# Patient Record
Sex: Female | Born: 1940 | Race: White | Hispanic: No | Marital: Single | Smoking: Never smoker
Health system: Southern US, Community
[De-identification: ages and names within clinical notes are randomized; demographics above are authoritative.]

## PROBLEM LIST (undated history)

## (undated) DIAGNOSIS — K648 Other hemorrhoids: Secondary | ICD-10-CM

## (undated) DIAGNOSIS — H3530 Unspecified macular degeneration: Secondary | ICD-10-CM

## (undated) DIAGNOSIS — D696 Thrombocytopenia, unspecified: Secondary | ICD-10-CM

## (undated) DIAGNOSIS — R918 Other nonspecific abnormal finding of lung field: Secondary | ICD-10-CM

## (undated) DIAGNOSIS — M069 Rheumatoid arthritis, unspecified: Secondary | ICD-10-CM

## (undated) DIAGNOSIS — C50919 Malignant neoplasm of unspecified site of unspecified female breast: Secondary | ICD-10-CM

## (undated) DIAGNOSIS — I2699 Other pulmonary embolism without acute cor pulmonale: Secondary | ICD-10-CM

## (undated) DIAGNOSIS — K5792 Diverticulitis of intestine, part unspecified, without perforation or abscess without bleeding: Secondary | ICD-10-CM

## (undated) DIAGNOSIS — R002 Palpitations: Secondary | ICD-10-CM

## (undated) DIAGNOSIS — I341 Nonrheumatic mitral (valve) prolapse: Secondary | ICD-10-CM

## (undated) HISTORY — DX: Rheumatoid arthritis, unspecified: M06.9

## (undated) HISTORY — DX: Other pulmonary embolism without acute cor pulmonale: I26.99

## (undated) HISTORY — DX: Other nonspecific abnormal finding of lung field: R91.8

## (undated) HISTORY — DX: Palpitations: R00.2

## (undated) HISTORY — DX: Nonrheumatic mitral (valve) prolapse: I34.1

## (undated) HISTORY — DX: Diverticulitis of intestine, part unspecified, without perforation or abscess without bleeding: K57.92

## (undated) HISTORY — DX: Unspecified macular degeneration: H35.30

## (undated) HISTORY — DX: Thrombocytopenia, unspecified: D69.6

## (undated) HISTORY — DX: Malignant neoplasm of unspecified site of unspecified female breast: C50.919

---

## 1999-05-06 ENCOUNTER — Encounter: Admission: RE | Admit: 1999-05-06 | Discharge: 1999-05-06 | Payer: Self-pay | Admitting: Obstetrics & Gynecology

## 2000-01-23 ENCOUNTER — Encounter: Admission: RE | Admit: 2000-01-23 | Discharge: 2000-01-23 | Payer: Self-pay | Admitting: Family Medicine

## 2000-01-23 ENCOUNTER — Encounter: Payer: Self-pay | Admitting: Family Medicine

## 2000-04-02 ENCOUNTER — Encounter: Admission: RE | Admit: 2000-04-02 | Discharge: 2000-04-02 | Payer: Self-pay | Admitting: *Deleted

## 2000-04-02 ENCOUNTER — Encounter: Admission: RE | Admit: 2000-04-02 | Discharge: 2000-04-02 | Payer: Self-pay | Admitting: Family Medicine

## 2000-04-23 ENCOUNTER — Encounter: Admission: RE | Admit: 2000-04-23 | Discharge: 2000-04-23 | Payer: Self-pay | Admitting: Family Medicine

## 2000-04-30 ENCOUNTER — Encounter: Admission: RE | Admit: 2000-04-30 | Discharge: 2000-04-30 | Payer: Self-pay | Admitting: Family Medicine

## 2000-05-07 ENCOUNTER — Encounter: Payer: Self-pay | Admitting: Sports Medicine

## 2000-05-07 ENCOUNTER — Encounter: Admission: RE | Admit: 2000-05-07 | Discharge: 2000-05-07 | Payer: Self-pay | Admitting: Sports Medicine

## 2000-06-20 ENCOUNTER — Other Ambulatory Visit: Admission: RE | Admit: 2000-06-20 | Discharge: 2000-06-20 | Payer: Self-pay | Admitting: Obstetrics & Gynecology

## 2001-05-14 ENCOUNTER — Encounter: Admission: RE | Admit: 2001-05-14 | Discharge: 2001-05-14 | Payer: Self-pay | Admitting: Obstetrics & Gynecology

## 2001-05-14 ENCOUNTER — Encounter: Payer: Self-pay | Admitting: Obstetrics & Gynecology

## 2001-07-04 ENCOUNTER — Encounter: Admission: RE | Admit: 2001-07-04 | Discharge: 2001-07-04 | Payer: Self-pay | Admitting: Sports Medicine

## 2001-07-22 ENCOUNTER — Other Ambulatory Visit: Admission: RE | Admit: 2001-07-22 | Discharge: 2001-07-22 | Payer: Self-pay | Admitting: Obstetrics & Gynecology

## 2001-07-24 HISTORY — PX: BREAST LUMPECTOMY: SHX2

## 2002-07-16 ENCOUNTER — Encounter: Admission: RE | Admit: 2002-07-16 | Discharge: 2002-07-16 | Payer: Self-pay | Admitting: Gastroenterology

## 2002-07-16 ENCOUNTER — Encounter: Payer: Self-pay | Admitting: Gastroenterology

## 2002-07-23 ENCOUNTER — Encounter (INDEPENDENT_AMBULATORY_CARE_PROVIDER_SITE_OTHER): Payer: Self-pay | Admitting: Specialist

## 2002-07-23 ENCOUNTER — Encounter: Payer: Self-pay | Admitting: Obstetrics & Gynecology

## 2002-07-23 ENCOUNTER — Encounter: Admission: RE | Admit: 2002-07-23 | Discharge: 2002-07-23 | Payer: Self-pay | Admitting: Obstetrics & Gynecology

## 2002-07-24 DIAGNOSIS — C50919 Malignant neoplasm of unspecified site of unspecified female breast: Secondary | ICD-10-CM

## 2002-07-24 HISTORY — DX: Malignant neoplasm of unspecified site of unspecified female breast: C50.919

## 2002-07-25 ENCOUNTER — Other Ambulatory Visit: Admission: RE | Admit: 2002-07-25 | Discharge: 2002-07-25 | Payer: Self-pay | Admitting: Obstetrics & Gynecology

## 2002-08-07 ENCOUNTER — Encounter: Admission: RE | Admit: 2002-08-07 | Discharge: 2002-08-07 | Payer: Self-pay | Admitting: Sports Medicine

## 2002-08-08 ENCOUNTER — Other Ambulatory Visit: Admission: RE | Admit: 2002-08-08 | Discharge: 2002-08-08 | Payer: Self-pay | Admitting: Radiology

## 2002-08-14 ENCOUNTER — Encounter: Payer: Self-pay | Admitting: *Deleted

## 2002-08-14 ENCOUNTER — Ambulatory Visit (HOSPITAL_COMMUNITY): Admission: RE | Admit: 2002-08-14 | Discharge: 2002-08-14 | Payer: Self-pay | Admitting: *Deleted

## 2002-08-27 ENCOUNTER — Ambulatory Visit (HOSPITAL_COMMUNITY): Admission: RE | Admit: 2002-08-27 | Discharge: 2002-08-27 | Payer: Self-pay | Admitting: Surgery

## 2002-08-27 ENCOUNTER — Encounter: Payer: Self-pay | Admitting: Surgery

## 2002-10-06 ENCOUNTER — Encounter: Admission: RE | Admit: 2002-10-06 | Discharge: 2002-10-06 | Payer: Self-pay | Admitting: *Deleted

## 2002-10-06 ENCOUNTER — Encounter: Payer: Self-pay | Admitting: *Deleted

## 2002-11-20 ENCOUNTER — Encounter: Payer: Self-pay | Admitting: *Deleted

## 2002-11-20 ENCOUNTER — Ambulatory Visit (HOSPITAL_COMMUNITY): Admission: RE | Admit: 2002-11-20 | Discharge: 2002-11-20 | Payer: Self-pay | Admitting: *Deleted

## 2002-12-08 ENCOUNTER — Encounter: Payer: Self-pay | Admitting: Surgery

## 2002-12-08 ENCOUNTER — Encounter: Admission: RE | Admit: 2002-12-08 | Discharge: 2002-12-08 | Payer: Self-pay | Admitting: Surgery

## 2002-12-08 ENCOUNTER — Ambulatory Visit (HOSPITAL_BASED_OUTPATIENT_CLINIC_OR_DEPARTMENT_OTHER): Admission: RE | Admit: 2002-12-08 | Discharge: 2002-12-08 | Payer: Self-pay | Admitting: Surgery

## 2002-12-08 ENCOUNTER — Encounter (INDEPENDENT_AMBULATORY_CARE_PROVIDER_SITE_OTHER): Payer: Self-pay | Admitting: *Deleted

## 2002-12-22 ENCOUNTER — Ambulatory Visit: Admission: RE | Admit: 2002-12-22 | Discharge: 2003-03-17 | Payer: Self-pay | Admitting: Radiation Oncology

## 2002-12-26 ENCOUNTER — Ambulatory Visit (HOSPITAL_BASED_OUTPATIENT_CLINIC_OR_DEPARTMENT_OTHER): Admission: RE | Admit: 2002-12-26 | Discharge: 2002-12-26 | Payer: Self-pay | Admitting: Surgery

## 2003-01-05 ENCOUNTER — Encounter: Admission: RE | Admit: 2003-01-05 | Discharge: 2003-01-05 | Payer: Self-pay | Admitting: Radiation Oncology

## 2003-02-04 ENCOUNTER — Ambulatory Visit (HOSPITAL_COMMUNITY): Admission: RE | Admit: 2003-02-04 | Discharge: 2003-02-04 | Payer: Self-pay | Admitting: Oncology

## 2003-02-04 ENCOUNTER — Encounter: Payer: Self-pay | Admitting: Oncology

## 2003-04-07 ENCOUNTER — Encounter: Admission: RE | Admit: 2003-04-07 | Discharge: 2003-04-07 | Payer: Self-pay | Admitting: Surgery

## 2003-04-07 ENCOUNTER — Encounter: Payer: Self-pay | Admitting: Surgery

## 2003-07-20 ENCOUNTER — Encounter: Admission: RE | Admit: 2003-07-20 | Discharge: 2003-07-20 | Payer: Self-pay | Admitting: Oncology

## 2003-07-27 ENCOUNTER — Encounter: Admission: RE | Admit: 2003-07-27 | Discharge: 2003-07-27 | Payer: Self-pay | Admitting: Oncology

## 2003-08-19 ENCOUNTER — Other Ambulatory Visit: Admission: RE | Admit: 2003-08-19 | Discharge: 2003-08-19 | Payer: Self-pay | Admitting: Obstetrics & Gynecology

## 2004-05-26 ENCOUNTER — Ambulatory Visit: Payer: Self-pay | Admitting: Sports Medicine

## 2004-07-20 ENCOUNTER — Encounter: Admission: RE | Admit: 2004-07-20 | Discharge: 2004-07-20 | Payer: Self-pay | Admitting: Oncology

## 2004-08-10 ENCOUNTER — Ambulatory Visit: Payer: Self-pay | Admitting: Oncology

## 2004-08-19 ENCOUNTER — Other Ambulatory Visit: Admission: RE | Admit: 2004-08-19 | Discharge: 2004-08-19 | Payer: Self-pay | Admitting: Obstetrics & Gynecology

## 2005-02-27 ENCOUNTER — Ambulatory Visit: Payer: Self-pay | Admitting: Oncology

## 2005-07-21 ENCOUNTER — Encounter: Admission: RE | Admit: 2005-07-21 | Discharge: 2005-07-21 | Payer: Self-pay | Admitting: Oncology

## 2005-07-26 ENCOUNTER — Ambulatory Visit: Payer: Self-pay | Admitting: Oncology

## 2005-08-22 ENCOUNTER — Other Ambulatory Visit: Admission: RE | Admit: 2005-08-22 | Discharge: 2005-08-22 | Payer: Self-pay | Admitting: Obstetrics & Gynecology

## 2005-11-02 ENCOUNTER — Ambulatory Visit: Payer: Self-pay | Admitting: Family Medicine

## 2005-12-08 ENCOUNTER — Emergency Department (HOSPITAL_COMMUNITY): Admission: EM | Admit: 2005-12-08 | Discharge: 2005-12-08 | Payer: Self-pay | Admitting: Family Medicine

## 2005-12-19 ENCOUNTER — Emergency Department (HOSPITAL_COMMUNITY): Admission: EM | Admit: 2005-12-19 | Discharge: 2005-12-20 | Payer: Self-pay | Admitting: Emergency Medicine

## 2005-12-21 ENCOUNTER — Ambulatory Visit: Payer: Self-pay | Admitting: Sports Medicine

## 2005-12-27 ENCOUNTER — Encounter: Admission: RE | Admit: 2005-12-27 | Discharge: 2005-12-27 | Payer: Self-pay | Admitting: Oncology

## 2005-12-29 ENCOUNTER — Ambulatory Visit: Payer: Self-pay | Admitting: Oncology

## 2006-01-04 ENCOUNTER — Encounter: Admission: RE | Admit: 2006-01-04 | Discharge: 2006-01-04 | Payer: Self-pay | Admitting: Oncology

## 2006-01-25 LAB — COMPREHENSIVE METABOLIC PANEL
Albumin: 3.9 g/dL (ref 3.5–5.2)
Alkaline Phosphatase: 62 U/L (ref 39–117)
BUN: 19 mg/dL (ref 6–23)
CO2: 24 mEq/L (ref 19–32)
Glucose, Bld: 101 mg/dL — ABNORMAL HIGH (ref 70–99)
Potassium: 3.7 mEq/L (ref 3.5–5.3)
Sodium: 140 mEq/L (ref 135–145)
Total Protein: 6.7 g/dL (ref 6.0–8.3)

## 2006-01-25 LAB — URINALYSIS, MICROSCOPIC - CHCC
Bilirubin (Urine): NEGATIVE
Glucose: NEGATIVE g/dL
Ketones: NEGATIVE mg/dL
Leukocyte Esterase: NEGATIVE

## 2006-01-25 LAB — CBC WITH DIFFERENTIAL/PLATELET
BASO%: 0.1 % (ref 0.0–2.0)
EOS%: 2.6 % (ref 0.0–7.0)
MCHC: 34.5 g/dL (ref 32.0–36.0)
MONO#: 0.4 10*3/uL (ref 0.1–0.9)
RBC: 4.01 10*6/uL (ref 3.70–5.32)
RDW: 14.1 % (ref 11.3–14.5)
WBC: 4.4 10*3/uL (ref 3.9–10.0)
lymph#: 1.2 10*3/uL (ref 0.9–3.3)

## 2006-02-08 ENCOUNTER — Encounter: Admission: RE | Admit: 2006-02-08 | Discharge: 2006-02-08 | Payer: Self-pay | Admitting: Oncology

## 2006-03-21 ENCOUNTER — Ambulatory Visit: Payer: Self-pay | Admitting: Oncology

## 2006-07-23 ENCOUNTER — Encounter: Admission: RE | Admit: 2006-07-23 | Discharge: 2006-07-23 | Payer: Self-pay | Admitting: Oncology

## 2006-07-24 HISTORY — PX: OTHER SURGICAL HISTORY: SHX169

## 2006-08-27 ENCOUNTER — Encounter: Admission: RE | Admit: 2006-08-27 | Discharge: 2006-08-27 | Payer: Self-pay | Admitting: Oncology

## 2006-09-06 ENCOUNTER — Ambulatory Visit: Payer: Self-pay | Admitting: Sports Medicine

## 2006-09-20 DIAGNOSIS — L719 Rosacea, unspecified: Secondary | ICD-10-CM

## 2006-09-20 DIAGNOSIS — I059 Rheumatic mitral valve disease, unspecified: Secondary | ICD-10-CM | POA: Insufficient documentation

## 2006-09-25 ENCOUNTER — Ambulatory Visit (HOSPITAL_COMMUNITY): Admission: RE | Admit: 2006-09-25 | Discharge: 2006-09-25 | Payer: Self-pay | Admitting: Sports Medicine

## 2006-09-25 ENCOUNTER — Encounter: Payer: Self-pay | Admitting: Cardiology

## 2006-11-01 ENCOUNTER — Encounter: Payer: Self-pay | Admitting: Sports Medicine

## 2006-11-13 ENCOUNTER — Encounter: Payer: Self-pay | Admitting: Sports Medicine

## 2007-02-12 ENCOUNTER — Ambulatory Visit: Payer: Self-pay | Admitting: Oncology

## 2007-02-14 ENCOUNTER — Encounter: Payer: Self-pay | Admitting: Sports Medicine

## 2007-02-14 LAB — COMPREHENSIVE METABOLIC PANEL
ALT: <8 U/L (ref 0–35)
AST: 13 U/L (ref 0–37)
Albumin: 3.8 g/dL (ref 3.5–5.2)
Alkaline Phosphatase: 53 U/L (ref 39–117)
BUN: 20 mg/dL (ref 6–23)
CO2: 24 mEq/L (ref 19–32)
Calcium: 8.5 mg/dL (ref 8.4–10.5)
Chloride: 108 mEq/L (ref 96–112)
Creatinine, Ser: 0.88 mg/dL (ref 0.40–1.20)
Glucose, Bld: 92 mg/dL (ref 70–99)
Potassium: 3.8 mEq/L (ref 3.5–5.3)
Sodium: 142 mEq/L (ref 135–145)
Total Bilirubin: 0.6 mg/dL (ref 0.3–1.2)
Total Protein: 6.3 g/dL (ref 6.0–8.3)

## 2007-02-14 LAB — CBC WITH DIFFERENTIAL/PLATELET
BASO%: 0.2 % (ref 0.0–2.0)
Basophils Absolute: 0 10*3/uL (ref 0.0–0.1)
EOS%: 2.9 % (ref 0.0–7.0)
Eosinophils Absolute: 0.1 10*3/uL (ref 0.0–0.5)
HCT: 38 % (ref 34.8–46.6)
HGB: 13.4 g/dL (ref 11.6–15.9)
LYMPH%: 25.7 % (ref 14.0–48.0)
MCH: 32.8 pg (ref 26.0–34.0)
MCHC: 35.3 g/dL (ref 32.0–36.0)
MCV: 92.7 fL (ref 81.0–101.0)
MONO#: 0.4 10*3/uL (ref 0.1–0.9)
MONO%: 9.4 % (ref 0.0–13.0)
NEUT#: 2.8 10*3/uL (ref 1.5–6.5)
NEUT%: 61.8 % (ref 39.6–76.8)
Platelets: 135 10*3/uL — ABNORMAL LOW (ref 145–400)
RBC: 4.09 10*6/uL (ref 3.70–5.32)
RDW: 13.4 % (ref 11.3–14.5)
WBC: 4.4 10*3/uL (ref 3.9–10.0)
lymph#: 1.1 10*3/uL (ref 0.9–3.3)

## 2007-03-24 ENCOUNTER — Inpatient Hospital Stay (HOSPITAL_COMMUNITY): Admission: EM | Admit: 2007-03-24 | Discharge: 2007-03-27 | Payer: Self-pay | Admitting: Emergency Medicine

## 2007-03-26 ENCOUNTER — Encounter (INDEPENDENT_AMBULATORY_CARE_PROVIDER_SITE_OTHER): Payer: Self-pay | Admitting: Emergency Medicine

## 2007-03-26 ENCOUNTER — Ambulatory Visit: Payer: Self-pay | Admitting: *Deleted

## 2007-07-25 DIAGNOSIS — I2699 Other pulmonary embolism without acute cor pulmonale: Secondary | ICD-10-CM

## 2007-07-25 HISTORY — DX: Other pulmonary embolism without acute cor pulmonale: I26.99

## 2007-07-30 ENCOUNTER — Encounter: Admission: RE | Admit: 2007-07-30 | Discharge: 2007-07-30 | Payer: Self-pay | Admitting: Oncology

## 2007-07-30 ENCOUNTER — Encounter: Payer: Self-pay | Admitting: Sports Medicine

## 2007-08-15 ENCOUNTER — Ambulatory Visit: Payer: Self-pay | Admitting: Oncology

## 2007-08-26 ENCOUNTER — Encounter: Admission: RE | Admit: 2007-08-26 | Discharge: 2007-08-26 | Payer: Self-pay | Admitting: Oncology

## 2007-09-22 ENCOUNTER — Encounter: Payer: Self-pay | Admitting: Sports Medicine

## 2007-10-10 ENCOUNTER — Encounter: Payer: Self-pay | Admitting: *Deleted

## 2007-10-10 ENCOUNTER — Ambulatory Visit: Payer: Self-pay | Admitting: Sports Medicine

## 2007-10-10 DIAGNOSIS — F411 Generalized anxiety disorder: Secondary | ICD-10-CM | POA: Insufficient documentation

## 2007-10-10 DIAGNOSIS — Z853 Personal history of malignant neoplasm of breast: Secondary | ICD-10-CM

## 2007-10-22 ENCOUNTER — Encounter: Payer: Self-pay | Admitting: Sports Medicine

## 2007-11-08 ENCOUNTER — Ambulatory Visit: Payer: Self-pay | Admitting: Family Medicine

## 2007-11-08 DIAGNOSIS — M79609 Pain in unspecified limb: Secondary | ICD-10-CM | POA: Insufficient documentation

## 2007-11-08 DIAGNOSIS — M549 Dorsalgia, unspecified: Secondary | ICD-10-CM | POA: Insufficient documentation

## 2007-11-08 LAB — CONVERTED CEMR LAB
Glucose, Urine, Semiquant: NEGATIVE
Ketones, urine, test strip: NEGATIVE
Specific Gravity, Urine: 1.02

## 2007-12-02 ENCOUNTER — Telehealth (INDEPENDENT_AMBULATORY_CARE_PROVIDER_SITE_OTHER): Payer: Self-pay | Admitting: *Deleted

## 2007-12-06 ENCOUNTER — Emergency Department (HOSPITAL_COMMUNITY): Admission: EM | Admit: 2007-12-06 | Discharge: 2007-12-06 | Payer: Self-pay | Admitting: Emergency Medicine

## 2007-12-10 ENCOUNTER — Ambulatory Visit: Payer: Self-pay | Admitting: Sports Medicine

## 2007-12-10 DIAGNOSIS — M159 Polyosteoarthritis, unspecified: Secondary | ICD-10-CM | POA: Insufficient documentation

## 2008-01-17 ENCOUNTER — Ambulatory Visit: Payer: Self-pay | Admitting: Oncology

## 2008-01-21 ENCOUNTER — Encounter: Payer: Self-pay | Admitting: Family Medicine

## 2008-01-22 LAB — ANTI-NUCLEAR AB-TITER (ANA TITER): ANA Titer 1: 1:80 {titer} — ABNORMAL HIGH

## 2008-01-22 LAB — ANTI-DNA ANTIBODY, DOUBLE-STRANDED: ds DNA Ab: <1 IU/mL (ref <5)

## 2008-01-22 LAB — RHEUMATOID FACTOR: Rhuematoid fact SerPl-aCnc: 45 IU/mL — ABNORMAL HIGH (ref 0–20)

## 2008-02-03 ENCOUNTER — Ambulatory Visit: Payer: Self-pay | Admitting: Psychiatry

## 2008-02-17 ENCOUNTER — Ambulatory Visit: Payer: Self-pay | Admitting: Psychiatry

## 2008-02-17 ENCOUNTER — Encounter: Payer: Self-pay | Admitting: Sports Medicine

## 2008-02-17 LAB — CBC WITH DIFFERENTIAL/PLATELET
BASO%: 0.4 % (ref 0.0–2.0)
EOS%: 0.9 % (ref 0.0–7.0)
HCT: 39.1 % (ref 34.8–46.6)
LYMPH%: 15.1 % (ref 14.0–48.0)
MCH: 32.4 pg (ref 26.0–34.0)
MCHC: 34.1 g/dL (ref 32.0–36.0)
NEUT%: 78.2 % — ABNORMAL HIGH (ref 39.6–76.8)
Platelets: 147 10*3/uL (ref 145–400)
RBC: 4.12 10*6/uL (ref 3.70–5.32)
lymph#: 2.1 10*3/uL (ref 0.9–3.3)

## 2008-02-17 LAB — COMPREHENSIVE METABOLIC PANEL
ALT: 12 U/L (ref 0–35)
AST: 14 U/L (ref 0–37)
Alkaline Phosphatase: 51 U/L (ref 39–117)
Creatinine, Ser: 1.17 mg/dL (ref 0.40–1.20)
Total Bilirubin: 0.8 mg/dL (ref 0.3–1.2)

## 2008-02-24 ENCOUNTER — Ambulatory Visit: Payer: Self-pay | Admitting: Psychiatry

## 2008-02-27 ENCOUNTER — Telehealth: Payer: Self-pay | Admitting: *Deleted

## 2008-05-25 ENCOUNTER — Ambulatory Visit: Payer: Self-pay | Admitting: Surgery

## 2008-05-25 ENCOUNTER — Inpatient Hospital Stay (HOSPITAL_COMMUNITY): Admission: EM | Admit: 2008-05-25 | Discharge: 2008-05-27 | Payer: Self-pay | Admitting: *Deleted

## 2008-05-26 ENCOUNTER — Encounter (INDEPENDENT_AMBULATORY_CARE_PROVIDER_SITE_OTHER): Payer: Self-pay | Admitting: Internal Medicine

## 2008-07-24 HISTORY — PX: US ECHOCARDIOGRAPHY: HXRAD669

## 2008-08-04 ENCOUNTER — Encounter: Admission: RE | Admit: 2008-08-04 | Discharge: 2008-08-04 | Payer: Self-pay | Admitting: Oncology

## 2008-09-22 ENCOUNTER — Encounter: Admission: RE | Admit: 2008-09-22 | Discharge: 2008-09-22 | Payer: Self-pay | Admitting: Cardiology

## 2009-01-19 ENCOUNTER — Emergency Department (HOSPITAL_COMMUNITY): Admission: EM | Admit: 2009-01-19 | Discharge: 2009-01-19 | Payer: Self-pay | Admitting: Emergency Medicine

## 2009-02-26 ENCOUNTER — Ambulatory Visit: Payer: Self-pay | Admitting: Oncology

## 2009-03-02 ENCOUNTER — Encounter: Payer: Self-pay | Admitting: Sports Medicine

## 2009-03-30 ENCOUNTER — Ambulatory Visit: Payer: Self-pay | Admitting: Oncology

## 2009-04-06 LAB — LUPUS ANTICOAGULANT PANEL
Drvvt confirmation: 1.54 Ratio — ABNORMAL HIGH (ref <1.18)
Lupus Anticoagulant: DETECTED
PTTLA Confirmation: 0 secs (ref <8.0)

## 2009-04-06 LAB — ANTITHROMBIN III: AntiThromb III Func: 103 % (ref 76–126)

## 2009-04-06 LAB — CARDIOLIPIN ANTIBODIES, IGG, IGM, IGA
Anticardiolipin IgA: <10 APL U/mL (ref <10)
Anticardiolipin IgM: <10 MPL U/mL (ref <10)

## 2009-04-06 LAB — PROTHROMBIN GENE MUTATION

## 2009-04-06 LAB — FACTOR 5 LEIDEN

## 2009-04-08 ENCOUNTER — Encounter: Payer: Self-pay | Admitting: Family Medicine

## 2009-04-11 LAB — CARDIOLIPIN ANTIBODIES, IGG, IGM, IGA
Anticardiolipin IgG: <10 GPL U/mL (ref <10)
Anticardiolipin IgM: <10 MPL U/mL (ref <10)

## 2009-04-11 LAB — BETA-2 GLYCOPROTEIN ANTIBODIES
Beta-2 Glyco I IgG: 11 SGU (ref <=20)
Beta-2-Glycoprotein I IgA: <9 SAU (ref <=20)

## 2009-06-29 ENCOUNTER — Ambulatory Visit: Payer: Self-pay | Admitting: Oncology

## 2009-07-06 LAB — LUPUS ANTICOAGULANT PANEL
PTT Lupus Anticoagulant: 51.4 secs — ABNORMAL HIGH (ref 32.0–43.4)
PTTLA 4:1 Mix: 45 secs (ref 36.3–48.8)
PTTLA Confirmation: 0.3 secs (ref <8.0)

## 2009-07-06 LAB — BETA-2 GLYCOPROTEIN ANTIBODIES
Beta-2-Glycoprotein I IgA: <3 U/mL (ref <=15)
Beta-2-Glycoprotein I IgM: 5 U/mL (ref <=15)

## 2009-07-06 LAB — CARDIOLIPIN ANTIBODIES, IGG, IGM, IGA
Anticardiolipin IgA: <3 APL U/mL (ref <10)
Anticardiolipin IgG: 3 GPL U/mL (ref <10)
Anticardiolipin IgM: <10 MPL U/mL (ref <10)

## 2009-08-05 ENCOUNTER — Encounter: Admission: RE | Admit: 2009-08-05 | Discharge: 2009-08-05 | Payer: Self-pay | Admitting: Oncology

## 2009-08-12 ENCOUNTER — Encounter: Admission: RE | Admit: 2009-08-12 | Discharge: 2009-08-12 | Payer: Self-pay | Admitting: Family Medicine

## 2009-09-04 ENCOUNTER — Emergency Department (HOSPITAL_COMMUNITY): Admission: EM | Admit: 2009-09-04 | Discharge: 2009-09-04 | Payer: Self-pay | Admitting: Emergency Medicine

## 2009-10-27 ENCOUNTER — Encounter: Admission: RE | Admit: 2009-10-27 | Discharge: 2009-10-27 | Payer: Self-pay | Admitting: Family Medicine

## 2009-10-31 IMAGING — CT CT CHEST W/O CM
2 of 3 series · 16 of 36 positions shown, 20 images · IV contrast (agent unspecified)
Comparison: 08/27/06.

CLINICAL DATA: Breast carcinoma.  Follow-up indeterminate pulmonary nodules. 
 CHEST CT WITHOUT CONTRAST:
TECHNIQUE: Multidetector CT imaging of the chest was performed following the standard protocol without IV contrast.

[Series 2: routine chest · axial · 0.70mm/px · z∈[-270,+30]mm · 13 of 68 slices shown, 17 images]
[im 5/68  mediastinal]
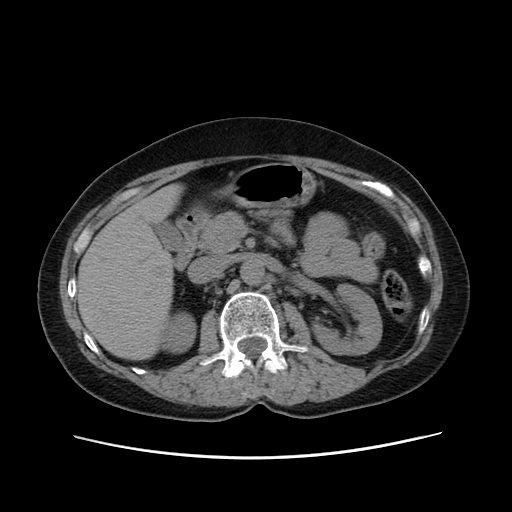
[im 5/68  lung]
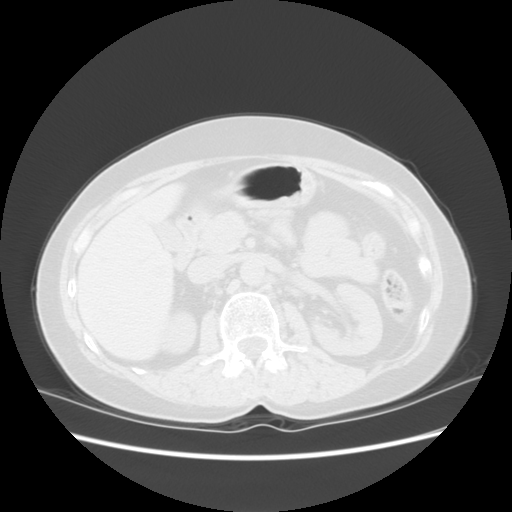
[im 10/68  lung]
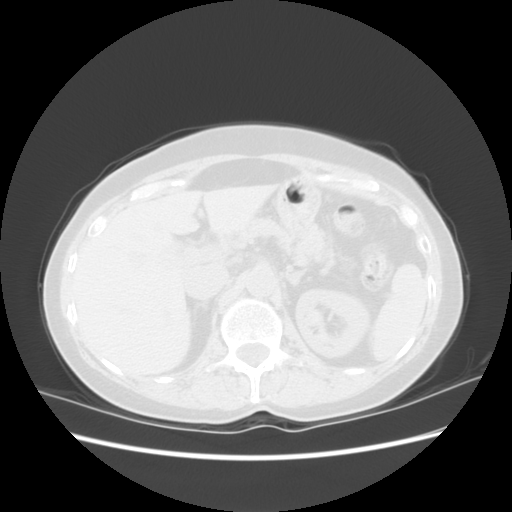
[im 15/68  lung]
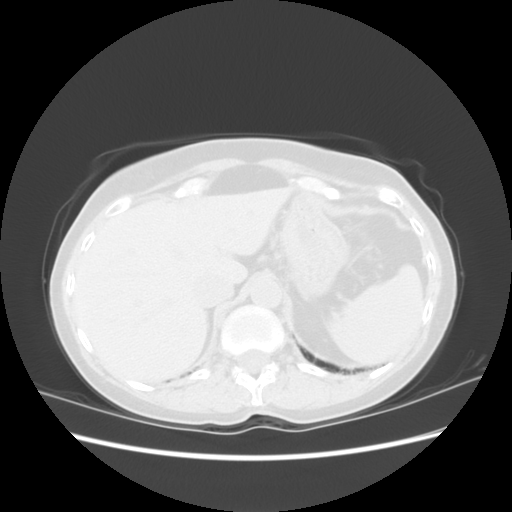
[im 20/68  lung]
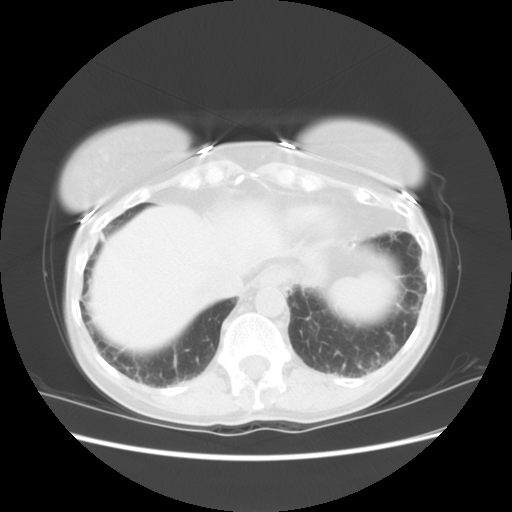
[im 25/68  mediastinal]
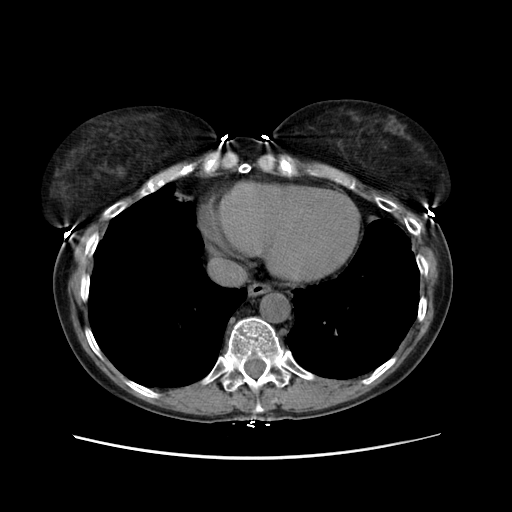
[im 25/68  lung]
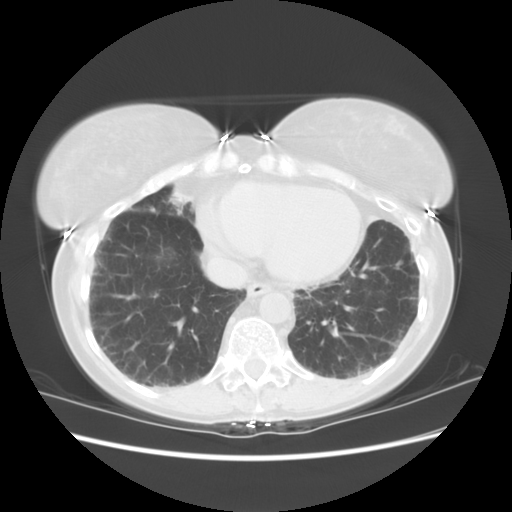
[im 30/68  lung]
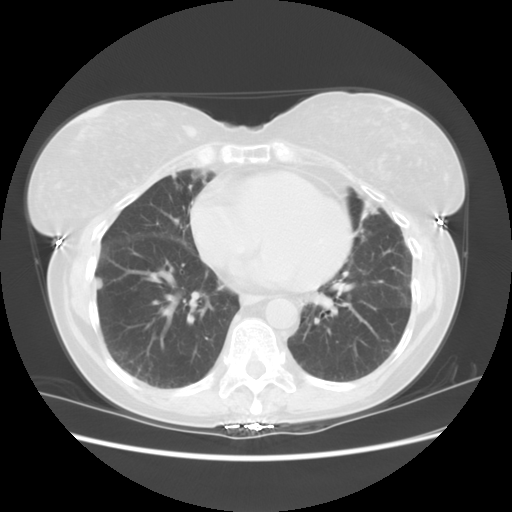
[im 35/68  lung]
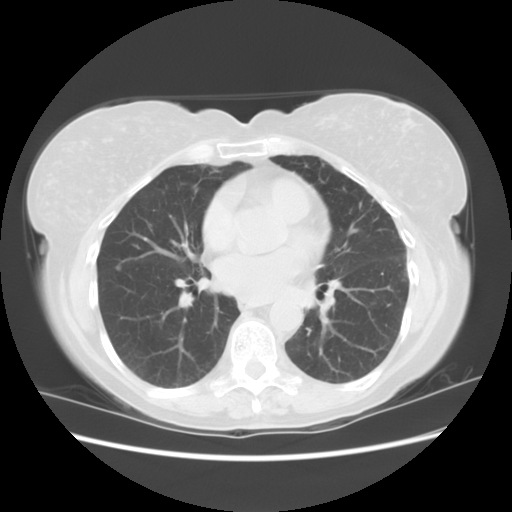
[im 40/68  lung]
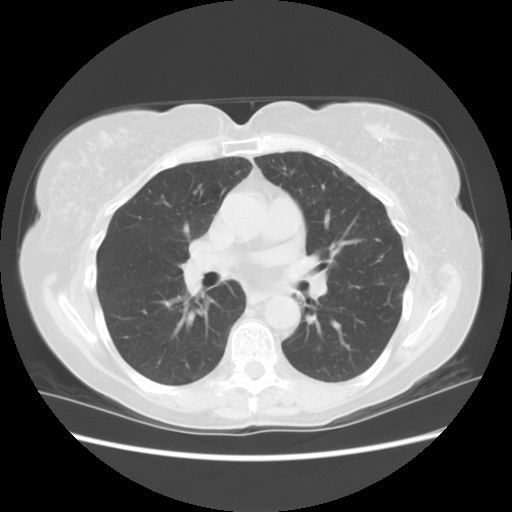
[im 45/68  mediastinal]
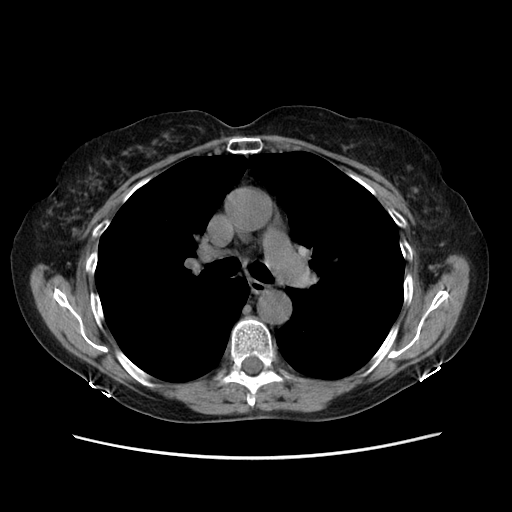
[im 45/68  lung]
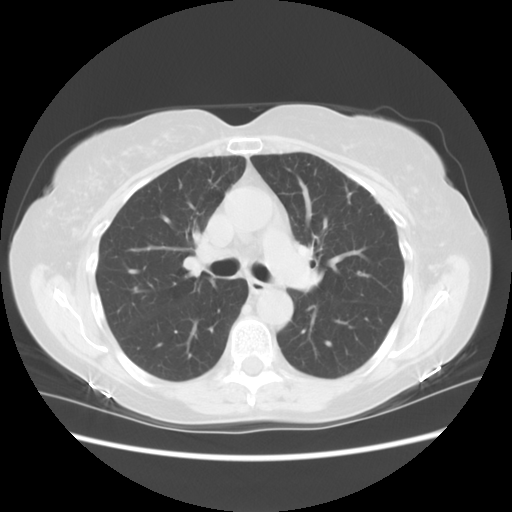
[im 50/68  lung]
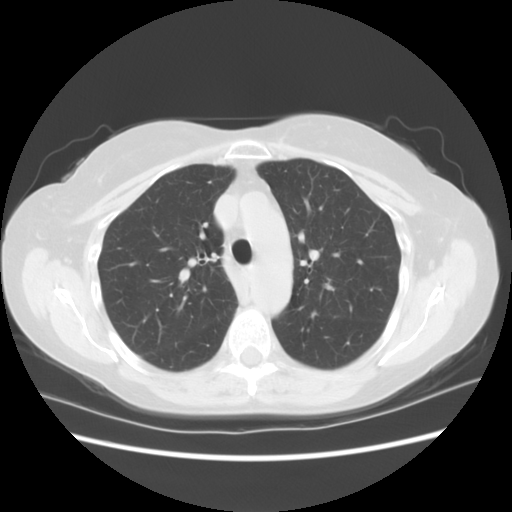
[im 55/68  lung]
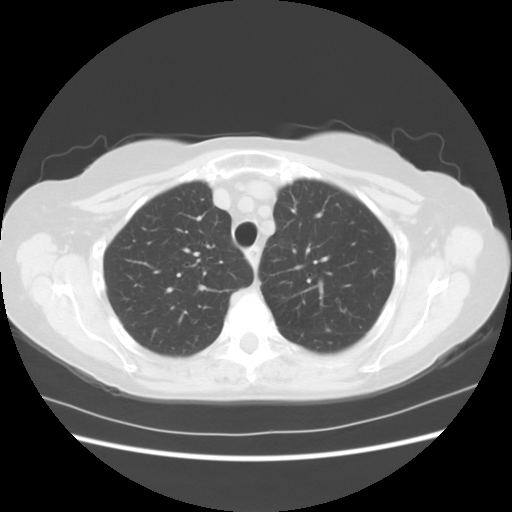
[im 60/68  lung]
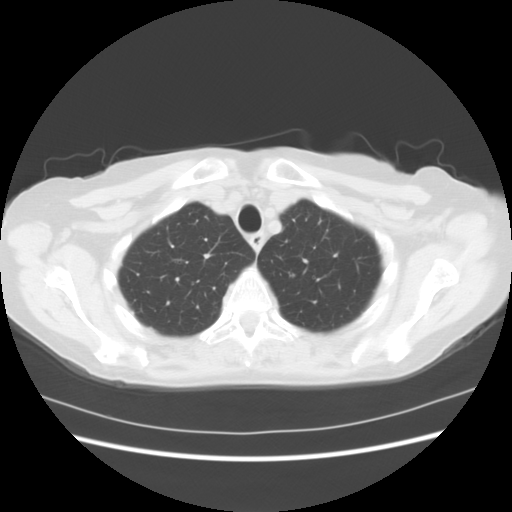
[im 65/68  mediastinal]
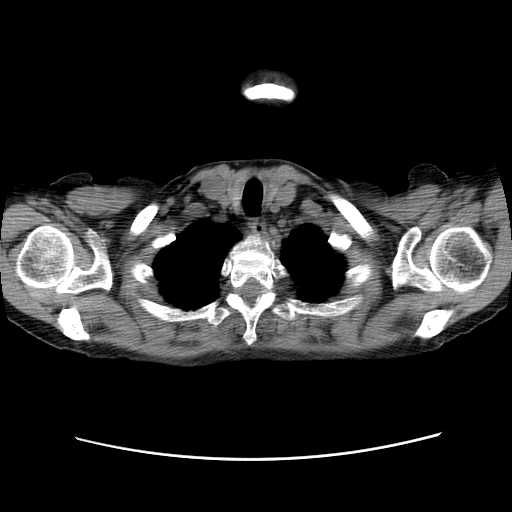
[im 65/68  lung]
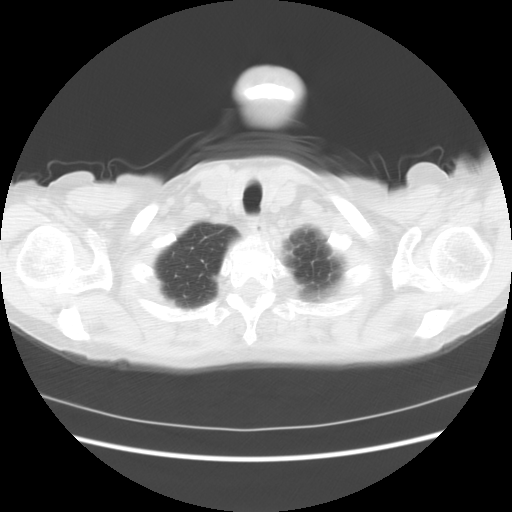

[Series 401: coronal · coronal · 0.70mm/px · 3 of 127 slices shown]
[im 26/127  lung]
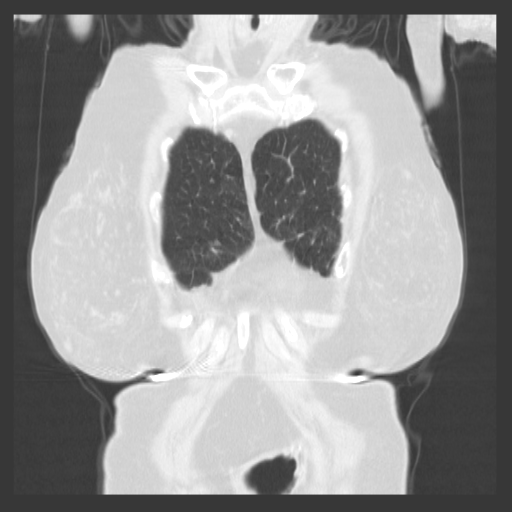
[im 51/127  lung]
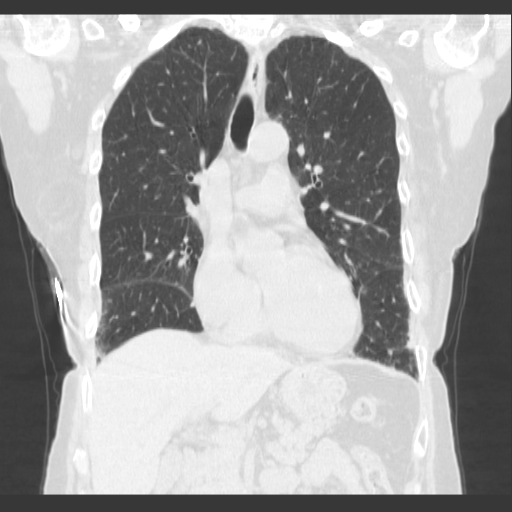
[im 76/127  lung]
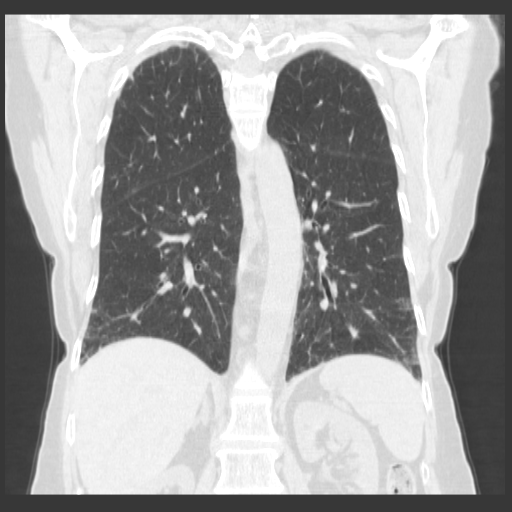

[16 of 36 positions shown; findings below may reference images not displayed]

FINDINGS: Smaller than 1 cm nodules in the right lung remain stable dating back to prior CTs from 0669 and 2881 and are consistent with a benign etiology.  No new or enlarging pulmonary nodules or masses are identified.  There is no evidence of pulmonary infiltrate.  There is no evidence of pleural or pericardial effusion.  There is no evidence of hilar or mediastinal lymphadenopathy.
IMPRESSION: Stable small right lung nodules consistent with benign etiology.  No evidence of metastatic disease or other acute findings.

## 2010-03-17 ENCOUNTER — Ambulatory Visit: Payer: Self-pay | Admitting: Cardiology

## 2010-03-23 ENCOUNTER — Ambulatory Visit: Payer: Self-pay | Admitting: Oncology

## 2010-03-25 ENCOUNTER — Encounter: Payer: Self-pay | Admitting: Family Medicine

## 2010-03-31 ENCOUNTER — Ambulatory Visit: Payer: Self-pay | Admitting: Cardiology

## 2010-04-28 ENCOUNTER — Ambulatory Visit: Payer: Self-pay | Admitting: Cardiology

## 2010-05-30 ENCOUNTER — Ambulatory Visit: Payer: Self-pay | Admitting: Cardiology

## 2010-06-30 ENCOUNTER — Ambulatory Visit: Payer: Self-pay | Admitting: Cardiology

## 2010-07-28 ENCOUNTER — Ambulatory Visit: Payer: Self-pay | Admitting: Oncology

## 2010-07-29 LAB — CARDIOLIPIN ANTIBODIES, IGG, IGM, IGA
Anticardiolipin IgA: 3 APL U/mL (ref <22)
Anticardiolipin IgG: 3 GPL U/mL (ref <23)
Anticardiolipin IgM: 6 MPL U/mL (ref <11)

## 2010-07-29 LAB — LUPUS ANTICOAGULANT PANEL
DRVVT 1:1 Mix: 42 secs (ref 36.2–44.3)
DRVVT: 64.5 secs — ABNORMAL HIGH (ref 36.2–44.3)
Lupus Anticoagulant: NOT DETECTED
PTT Lupus Anticoagulant: 56.6 secs — ABNORMAL HIGH (ref 30.0–45.6)
PTTLA 4:1 Mix: 46.4 secs — ABNORMAL HIGH (ref 30.0–45.6)
PTTLA Confirmation: 2.4 secs (ref <8.0)

## 2010-07-29 LAB — BETA-2 GLYCOPROTEIN ANTIBODIES
Beta-2 Glyco I IgG: 0 G Units (ref <20)
Beta-2-Glycoprotein I IgA: 3 A Units (ref <20)
Beta-2-Glycoprotein I IgM: 25 M Units — ABNORMAL HIGH (ref <20)

## 2010-08-08 ENCOUNTER — Ambulatory Visit: Payer: Self-pay | Admitting: Cardiology

## 2010-08-11 ENCOUNTER — Encounter
Admission: RE | Admit: 2010-08-11 | Discharge: 2010-08-11 | Payer: Self-pay | Source: Home / Self Care | Attending: Oncology | Admitting: Oncology

## 2010-08-14 ENCOUNTER — Encounter: Payer: Self-pay | Admitting: Oncology

## 2010-08-22 ENCOUNTER — Ambulatory Visit: Payer: Self-pay | Admitting: Cardiology

## 2010-08-23 NOTE — Consult Note (Signed)
Summary: [facility]  [facility]   Imported By: Clydell Hakim 04/14/2010 12:01:11  _____________________________________________________________________  External Attachment:    Type:   Image     Comment:   External Document

## 2010-08-23 NOTE — Consult Note (Signed)
Summary: [facility]  [facility]   Imported By: Clydell Hakim 04/14/2010 12:00:21  _____________________________________________________________________  External Attachment:    Type:   Image     Comment:   External Document

## 2010-09-01 ENCOUNTER — Ambulatory Visit (INDEPENDENT_AMBULATORY_CARE_PROVIDER_SITE_OTHER): Payer: Medicare Other | Admitting: Cardiology

## 2010-09-01 DIAGNOSIS — I2699 Other pulmonary embolism without acute cor pulmonale: Secondary | ICD-10-CM

## 2010-09-01 DIAGNOSIS — I059 Rheumatic mitral valve disease, unspecified: Secondary | ICD-10-CM

## 2010-09-14 ENCOUNTER — Other Ambulatory Visit: Payer: Self-pay

## 2010-09-14 ENCOUNTER — Encounter: Payer: Self-pay | Admitting: *Deleted

## 2010-09-29 ENCOUNTER — Encounter (INDEPENDENT_AMBULATORY_CARE_PROVIDER_SITE_OTHER): Payer: Medicare Other

## 2010-09-29 DIAGNOSIS — I2699 Other pulmonary embolism without acute cor pulmonale: Secondary | ICD-10-CM

## 2010-09-29 DIAGNOSIS — Z7901 Long term (current) use of anticoagulants: Secondary | ICD-10-CM

## 2010-10-12 LAB — DIFFERENTIAL
Basophils Relative: 0 % (ref 0–1)
Eosinophils Relative: 1 % (ref 0–5)
Lymphs Abs: 1.9 10*3/uL (ref 0.7–4.0)

## 2010-10-12 LAB — CBC
HCT: 38.2 % (ref 36.0–46.0)
Hemoglobin: 13.2 g/dL (ref 12.0–15.0)
MCHC: 34.5 g/dL (ref 30.0–36.0)
MCV: 96.3 fL (ref 78.0–100.0)
WBC: 8.7 10*3/uL (ref 4.0–10.5)

## 2010-10-12 LAB — DIGOXIN LEVEL: Digoxin Level: 0.6 ng/mL — ABNORMAL LOW (ref 0.8–2.0)

## 2010-10-12 LAB — URINALYSIS, ROUTINE W REFLEX MICROSCOPIC
Bilirubin Urine: NEGATIVE
Glucose, UA: NEGATIVE mg/dL
Specific Gravity, Urine: 1.024 (ref 1.005–1.030)

## 2010-10-12 LAB — BASIC METABOLIC PANEL
BUN: 16 mg/dL (ref 6–23)
Calcium: 9.5 mg/dL (ref 8.4–10.5)
GFR calc Af Amer: >60 mL/min (ref >60)
GFR calc non Af Amer: 50 mL/min — ABNORMAL LOW (ref >60)
Potassium: 3.6 mEq/L (ref 3.5–5.1)

## 2010-10-12 LAB — APTT: aPTT: 42 seconds — ABNORMAL HIGH (ref 24–37)

## 2010-10-12 LAB — URINE MICROSCOPIC-ADD ON

## 2010-10-12 LAB — PROTIME-INR: INR: 1.74 — ABNORMAL HIGH (ref 0.00–1.49)

## 2010-10-12 LAB — CK TOTAL AND CKMB (NOT AT ARMC): CK, MB: 0.4 ng/mL (ref 0.3–4.0)

## 2010-10-31 ENCOUNTER — Ambulatory Visit (INDEPENDENT_AMBULATORY_CARE_PROVIDER_SITE_OTHER): Payer: Medicare Other | Admitting: *Deleted

## 2010-10-31 DIAGNOSIS — I2699 Other pulmonary embolism without acute cor pulmonale: Secondary | ICD-10-CM

## 2010-10-31 DIAGNOSIS — Z7901 Long term (current) use of anticoagulants: Secondary | ICD-10-CM

## 2010-10-31 LAB — POCT INR: INR: 2.7

## 2010-12-05 ENCOUNTER — Encounter: Payer: Self-pay | Admitting: *Deleted

## 2010-12-06 ENCOUNTER — Ambulatory Visit (INDEPENDENT_AMBULATORY_CARE_PROVIDER_SITE_OTHER): Payer: Medicare Other | Admitting: Cardiology

## 2010-12-06 ENCOUNTER — Ambulatory Visit (INDEPENDENT_AMBULATORY_CARE_PROVIDER_SITE_OTHER): Payer: Medicare Other | Admitting: *Deleted

## 2010-12-06 ENCOUNTER — Encounter: Payer: Self-pay | Admitting: Cardiology

## 2010-12-06 DIAGNOSIS — Z7901 Long term (current) use of anticoagulants: Secondary | ICD-10-CM

## 2010-12-06 DIAGNOSIS — I059 Rheumatic mitral valve disease, unspecified: Secondary | ICD-10-CM

## 2010-12-06 DIAGNOSIS — L80 Vitiligo: Secondary | ICD-10-CM

## 2010-12-06 DIAGNOSIS — R002 Palpitations: Secondary | ICD-10-CM | POA: Insufficient documentation

## 2010-12-06 DIAGNOSIS — I2699 Other pulmonary embolism without acute cor pulmonale: Secondary | ICD-10-CM

## 2010-12-06 DIAGNOSIS — M069 Rheumatoid arthritis, unspecified: Secondary | ICD-10-CM

## 2010-12-06 NOTE — Discharge Summary (Signed)
Cathy Leonard, Cathy Leonard                 ACCOUNT NO.:  [id number]   MEDICAL RECORD NO.:  [id number]          PATIENT TYPE:  INP   LOCATION:  1429                         FACILITY:  [facility]   PHYSICIAN:  Michelene Gardener, MD    DATE OF BIRTH:  06/17/1941   DATE OF ADMISSION:  05/25/2008  DATE OF DISCHARGE:                               DISCHARGE SUMMARY   PRIMARY PHYSICIAN:  Patient does not have any primary physician at this  time, was admitted as unassigned.   DISCHARGE DIAGNOSES:  1. Right-sided acute pulmonary embolism.  2. Mild hyponatremia.  3. History of breast cancer status post chemo and radiation therapy.  4. History of arthritis.  5. Hypertension.  6. Osteoporosis.  7. History of arrhythmia.   DISCHARGE MEDICATIONS:  New meds:  1. Coumadin 5 mg p.o. once daily.  2. Lovenox 60 mg subcu twice daily x5 days.   Old medications include:  1. Fosamax 70 mg weekly.  2. Inderal 10 mg twice daily.  3. Xanax 0.25 mg as needed for anxiety.  4. Multivitamin 1 tab p.o. once daily.  5. Prednisone 10 mg in the a.m. and 5 mg at p.m.   CONSULTATIONS:  None.   PROCEDURES:  None.   RADIOLOGY STUDIES:  1. Chest x-ray on May 25, 2008, showed 1.4-cm right apical      pulmonary nodule with emphysematous change.  2. CT angiography on May 25, 2008, showed moderate-size right      lower lobe pulmonary embolism with right base atelectasis and      pleural thickening and there is increase in size of right hilar      lymph nodes and pleural-based right lobe mass and nodularity on      both breasts.  3. CT of pelvis without contrast on May 25, 2008, showed there is      no acute process.  4. CT of the abdomen without contrast showed no acute findings.   COURSE OF HOSPITALIZATION.:  1. Moderate-size pulmonary embolism.  Patient was admitted to the      hospital.  Initially, she was put on oxygen and then her oxygen was      tapered down.  Patient was started on heparin which was  switched      later on to Lovenox and she was put on Coumadin.  Patient improved      quick in the hospital.  On the time of discharge, patient is very      stable.  She denied chest pain.  She denied shortness of breath.      Her current INR is 1.4.  I discussed the possibility of keeping her      in the hospital until her INR is therapeutic versus sending her      home on Coumadin and Lovenox.  Patient is very eager to go home and      would like to follow as an outpatient with her cardiologist.  I      will discharge her on Coumadin 5 mg to be taken once a day.  We  will also put her on Lovenox 60 mg subcu twice daily.  Patient      currently does not have primary doctor but she had multiple doctors      including cardiologist, rheumatologist, and oncologist.  I advised      her to see her cardiologist in 2 days to check her INR and to      adjust her Coumadin accordingly.  I also discussed the need to      establish a primary care physician and patient is in agreement.  2. Pleural base mass.  Patient had a mass in her pleura that has been      seen in her CT of the chest.  Oncology was consulted for possible      CT-guided biopsy.  As per oncology, her mass has been stable for      the last 4 years and no need for further intervention at this time.   DISPOSITION:  Otherwise, other medical conditions remain stable at the  present time.  Patient will be discharged on all preadmission  medications.   TOTAL ASSESSMENT TIME:  40 minutes.      Michelene Gardener, MD  Electronically Signed     NAE/MEDQ  D:  05/27/2008  T:  05/27/2008  Job:  [id number]

## 2010-12-06 NOTE — Assessment & Plan Note (Signed)
The patient has a past history of unprovoked pulmonary embolism in 2009.  She has had a thorough a thrombophilia workup by Dr. Truett Perna and then saw a coagulation specialist at [facility] on 09/27/10 who felt that in view of her unprovoked pulmonary embolus that she would need to be on warfarin indefinitely.  When he checked her in March her d-dimer was positive even on anticoagulation albeit only minimally.  The patient is doing well on her Coumadin.  She has not experienced any complications from the anticoagulation.

## 2010-12-06 NOTE — Progress Notes (Signed)
Cathy Leonard Date of Birth:  12-Aug-1940 [facility] [address]   [address] [place], [place]  [address] [phone]           Fax   [phone]  History of Present Illness: This pleasant 70 year old woman is seen for a scheduled followup office visit.  She has a complex past medical history.  She has a history of a previous pulmonary embolus in 2009.  This was an unprovoked embolus.  No DVT was found.  She subsequently underwent a hypercoagulability workup by hematology and more recently was seen at [facility] by hematology and was told that she would need to be on lifelong Coumadin.  Patient also has a past history of mitral valve prolapse per her last echocardiogram was in 01/07/09 and showed mitral valve prolapse with mild mitral regurgitation.  The patient does not have known coronary disease.  She's had some atypical chest pain in the past and she had a normal treadmill Cardiolite stress test on 11/13/06.  Patient also has a history of breast cancer of the left breast in 2004.  She also has a history of rheumatoid arthritis and is on Enbrel.  She also has a history of vitiligo.  Current Outpatient Prescriptions  Medication Sig Dispense Refill  . alendronate (FOSAMAX) 70 MG tablet Take 70 mg by mouth every 7 (seven) days. Take in the morning with a full glass of water, on an empty stomach, and do not take anything else by mouth or lie down for the next 30 min.       Marland Kitchen ALPRAZolam (XANAX) 0.25 MG tablet Take 0.25 mg by mouth 3 (three) times daily.        . Calcium Carbonate-Vitamin D (CALCIUM + D PO) Take 600 mg by mouth daily.       . Cholecalciferol (VITAMIN D PO) Take 2,000 Units by mouth daily.        . digoxin (LANOXIN) 0.25 MG tablet Take 250 mcg by mouth daily. 1/2 daily        . etanercept (ENBREL) 50 MG/ML injection Inject 50 mg into the skin once a week.       . Multiple Vitamins-Minerals (EYE-VITES) TABS Take by mouth daily.        . propranolol  (INDERAL) 20 MG tablet Take 20 mg by mouth 2 (two) times daily.        Marland Kitchen warfarin (COUMADIN) 5 MG tablet Take 5 mg by mouth daily.        Marland Kitchen DISCONTD: cyclobenzaprine (FLEXERIL) 10 MG tablet Take 10 mg by mouth 2 (two) times daily as needed.        Marland Kitchen DISCONTD: diclofenac (VOLTAREN) 75 MG EC tablet Take 75 mg by mouth 2 (two) times daily as needed.       Marland Kitchen DISCONTD: propranolol (INDERAL) 10 MG tablet Take 20 mg by mouth 2 (two) times daily.         No Known Allergies  Patient Active Problem List  Diagnoses  . ANXIETY STATE, UNSPECIFIED  . MITRAL VALVE PROLAPSE  . ROSACEA  . GENERALIZED OSTEOARTHROSIS INVOLVING HAND  . BACK PAIN, ACUTE  . HAND PAIN  . PERSONAL HISTORY OF MALIGNANT NEOPLASM OF BREAST  . Pulmonary embolism  . Long term current use of anticoagulant  . Palpitations  . Rheumatoid arthritis  . Vitiligo    History  Smoking status  . Never Smoker   Smokeless tobacco  . Not on file    History  Alcohol Use: Not on file    No family history on file.  Review of Systems: Constitutional: no fever chills diaphoresis or fatigue or change in weight.  Head and neck: no hearing loss, no epistaxis, no photophobia or visual disturbance. Respiratory: No cough, shortness of breath or wheezing. Cardiovascular: No chest pain peripheral edema, palpitations. Gastrointestinal: No abdominal distention, no abdominal pain, no change in bowel habits hematochezia or melena. Genitourinary: No dysuria, no frequency, no urgency, no nocturia. Musculoskeletal:No arthralgias, no back pain, no gait disturbance or myalgias. Neurological: No dizziness, no headaches, no numbness, no seizures, no syncope, no weakness, no tremors. Hematologic: No lymphadenopathy, no easy bruising. Psychiatric: No confusion, no hallucinations, no sleep disturbance.    Physical Exam: Filed Vitals:   12/06/10 1520  BP: 130/70  Pulse: 64  The general appearance reveals a well-developed well-nourished woman in  no distress.Pupils equal and reactive.   Extraocular Movements are full.  There is no scleral icterus.  The mouth and pharynx are normal.  The neck is supple.  The carotids reveal no bruits.  The jugular venous pressure is normal.  The thyroid is not enlarged.  There is no lymphadenopathy.The chest is clear to percussion and auscultation. There are no rales or rhonchi. Expansion of the chest is symmetrical.The precordium is quiet.  The first heart sound is normal.  The second heart sound is physiologically split.  There is no murmur gallop rub or click.  There is no abnormal lift or heave.The abdomen is soft and nontender. Bowel sounds are normal. The liver and spleen are not enlarged. There Are no abdominal masses. There are no bruits.The pedal pulses are good.  There is no phlebitis or edema.  There is no cyanosis or clubbing.Strength is normal and symmetrical in all extremities.  There is no lateralizing weakness.  There are no sensory deficits. Integument reveals vitiligo.   Assessment / Plan: Patient will continue same medication.  Her INR is 3.0.  Recheck for monthly protimes and we'll see her in 3 months for followup office visit and EKG.  She was told by her primary care provider that she had elevated cholesterol and was prediabetic and her primary care provider we'll followup on this in several months.  The patient sought Dr. Evette Cristal for consideration of colonoscopy.  She was told that she would need to come off her Coumadin in order to have the procedure.  She is concerned about stopping her Coumadin but I told her that we could use Lovenox bridging if she wanted to proceed with the procedure.  She does not have a family history of colon cancer.

## 2010-12-06 NOTE — Assessment & Plan Note (Signed)
The patient has a past history of mitral valve prolapse.  Her last echocardiogram on 6/17/10Showed mitral valve prolapse with mild mitral regurgitation and she had normal LV systolic function with impaired relaxation.  She had a small pericardial effusion without hemodynamic consequence.

## 2010-12-06 NOTE — Assessment & Plan Note (Signed)
The patient has not been expressing any chest pain.  She does not have any history of known coronary ischemia.  On 11/13/06 she had a normal treadmill Cardiolite stress test.  She has been experiencing occasional palpitations.  These occur very briefly and did not last long enough for her to come in for a stat EKG.

## 2010-12-06 NOTE — H&P (Signed)
Cathy Leonard, Cathy Leonard                 ACCOUNT NO.:  [id number]   MEDICAL RECORD NO.:  [id number]          PATIENT TYPE:  EMS   LOCATION:  ED                           [facility]   PHYSICIAN:  Estelle Grumbles, MDDATE OF BIRTH:  02-08-1941   DATE OF ADMISSION:  05/25/2008  DATE OF DISCHARGE:                              HISTORY & PHYSICAL   PRIMARY CARE PHYSICIAN:  None at this time.   ONCOLOGIST:  Leighton Roach Truett Perna, M.D.   CHIEF COMPLAINT:  Right flank pain.   HISTORY OF PRESENT ILLNESS:  Ms. Simerson is a 70 year old female with past  medical history of hypertension, rheumatoid arthritis, presented to the  emergency room with right flank pain.  The patient states that she woke  up yesterday morning with right sided back pain and it was persistent  from that time, it was not getting worse with movement.  She thought  that the pain might relieve by itself and she tried to follow up with  the medical doctor as outpatient.  However, as the patient was  persistent, she decided to come to the emergency room.  In other words,  the patient denies having any fever.  No cough and no chest pain.  No  dyspnea.  No orthopnea.  She states that recently she has gained a  little bit of weight with the steroid use.  She denies having any  headache, dizziness, lightheadedness.  Denies any lower extremity edema,  swelling or pain in her calf region.  She denies having any long  traveling in the recent past.   REVIEW OF SYSTEMS:  As mentioned in history of present illness, the  patient denies having any dysphagia, dysarthria.  No visual or auditory  symptoms.  She denies having any prior history of lung clots.  Denies  having any dysuria.  No frequency, urgency or change in her bowel  habits.  She is tolerating her diet well and denies having any muscle  cramps.  No weakness, tingling, numbness.  Other review of systems were  asked and negative.   PAST MEDICAL HISTORY:  1. Hypertension.  2.  She was also thought to have irregular heart.  However, she is not      taking any blood thinner medications. She had a stress test done      approximately a year ago at her primary care physician's office and      she was told it was okay.  3. She also has a history of hypertension, rheumatoid arthritis.  4. Osteoporosis.  5. She was diagnosed with breast cancer approximately 5 years ago.      She  underwent surgery, chemotherapy, radiation therapy.      Subsequently, she had regular mammograms and came back okay.   PAST SURGICAL HISTORY:  Surgery for her breast cancer.   SOCIAL HISTORY:  She denies smoking.  No alcohol or drug use.  She used  to work at [employer].  She retired a year ago.   HOME MEDICATIONS:  1. Fosamax 70 mg q.weekly.  2. Inderal 10 mg b.i.d.  3. Xanax 0.25 mg as needed for anxiety.  4. Multivitamin one tablet p.o. daily.  5. Prednisone 10 mg in a.m., 5 mg p.m.   FAMILY HISTORY:  Both of her parents passed away.  Denies any  significant medical issues in her family members.   ALLERGIES:  None.   PHYSICAL EXAMINATION:  GENERAL:  The patient is alert, awake, oriented.  She currently has pain that is much, much better.  Well-developed, well-  nourished lady, not in acute distress.  VITAL SIGNS:  At the time of ED arrival, temperature was 98.4, pulse  120, respirations 20, blood pressure 142/80.  HEENT:  Normocephalic, atraumatic.  Pupils equal, round and reactive to  light and accommodation.  No icterus was noted.  NECK:  Supple.  No JVD was noted.  CHEST:  Bilateral air entry.  No crepitus rales.  No chest wall  tenderness.  HEART:  S1, S2.  Regular rate and rhythm.  No murmurs were noted.  ABDOMEN:  Soft, bowel sounds positive.  Nontender, nondistended.  Does  have some discomfort in the right lower quadrant and in the pelvic  region.  No obvious tenderness.  No guarding, rigidity.  No CVA  tenderness was noted.  EXTREMITIES:  No edema. No calf  tenderness.  Good peripheral pulsations.  SKIN:  No rash was noted.  No ulcers.  CNS:  No focal motor neurological deficits.   LABORATORY DATA:  Urinalysis revealed a small leukocyte esterase, 0-2  WBCs and rare bacteria.  Urine with red Cd negative.  CK:  Total CK 16  with MB fraction less than 1.0 and troponin 0.01.  Lipase level 19.  Sodium 134, potassium 3.6, chloride 102, bicarbonate 26, glucose 127,  BUN 12, creatinine 0.99.  Liver function tests are essentially normal  except hypoproteinemia.  Albumin level 2.9 and total protein level 5.6,  calcium 8.3.  White count 12.9, hemoglobin 14.5, hematocrit 42.3,  platelets 134.   EKG:  Sinus rhythm with no acute ST-T wave changes.   Chest x-ray revealed 154 cm lung nodule in the right apical region,  recommending CT chest, positive emphysematous changes and bibasilar  scarring which is stable.  CT of the chest without contrast revealed  less than 1 cm nodule in the right lung which is stable.  CT angiogram  revealed moderate right lower lung  pulmonary embolus, positive right  basilar atelectasis and pleural thickening, increasing the size of right  hilar lymph nodes.  There is a pleural based mass measuring 9.4 x 7 mm  which previously measured as an 8.4 x 4.3 mm.  Positive post surgical  changes in the left breast.   CT of the abdomen and pelvis, no evidence of renal collecting system  obstruction.  No evidence of metastatic disease.  No ureteral calculi.   IMPRESSION:  1. Right sided moderate acute pulmonary embolus.  2. Mild hyponatremia.  3. History of breast cancer status post chemo and radiation therapy.  4. History of arthritis.  5. History of hypertension.  6. History of arrhythmia.   PLAN:  1. Will admit the patient to the telemetry unit.  Will start the      patient on Lovenox and Coumadin.  UTI and that has to be followed      daily.  Coumadin dose has to be adjusted as needed.  We will follow      up bilateral  lower extremity Dopplers to rule out DVT.  Will      continue to  hold the medications.  Will ask case management to help      the patient to choose a primary care physician.  She needs to have      a primary care physician prior to the discharge for follow up of      her Coumadin and her other medical problems.  The patient has long      history of lung nodules.  However, we do not know whether there is      any significant change.  It still seems to be a very small nodule,      less than 1 cm.  We recommended the patient to follow up with her      Oncologist.  She might need to follow up repeat CT to insure the      stability of the lung nodule.  If there is significant growth in      the lung nodule, she needs to have a biopsy and further follow up.      I have explained to the patient about this one, and she understands      it.  2. Will follow up electrolytes.  Will not start any antibiotics at      this time.      Estelle Grumbles, MD  Electronically Signed     TP/MEDQ  D:  05/25/2008  T:  05/25/2008  Job:  [id number]   cc:   Leighton Roach. Truett Perna, M.D.  Fax: [phone]

## 2010-12-06 NOTE — Discharge Summary (Signed)
Cathy Leonard, Cathy Leonard                 ACCOUNT NO.:  [id number]   MEDICAL RECORD NO.:  [id number]          PATIENT TYPE:  INP   LOCATION:  3737                         FACILITY:  [facility]   PHYSICIAN:  Cassell Clement, M.D. DATE OF BIRTH:  11/06/1940   DATE OF ADMISSION:  03/24/2007  DATE OF DISCHARGE:  03/27/2007                               DISCHARGE SUMMARY   FINAL DIAGNOSES:  1. Acute vertigo.  2. Palpitations.  3. Hypertensive cardiovascular disease.  4. Headache.   OPERATIONS PERFORMED:  Carotid Dopplers and head CT.   HISTORY:  This 70 year old Caucasian female with a past history of heart  murmur and palpitations was admitted as an emergency by Dr. Carolanne Grumbling  on March 24, 2007 because of sudden onset of dizziness and nausea and  vertigo.  She denied chest pain or shortness of breath.  She has had a  history of palpitations and labile hypertension.  She has not tolerated  several medications well and at the present time is on low-dose  verapamil.  She did not tolerate metoprolol.   MEDICATIONS:  Her medications at the present time include:  1. Fosamax 70 mg a week.  2. Verapamil 120 mg sustained released daily.  3. Xanax p.r.n..  4. Calcium daily.   PAST MEDICAL HISTORY:  Positive for breast cancer 4 years ago, and she  has been on Arimidex and Dr. Truett Perna is her oncologist.  She has had a  past history of chemotherapy and radiation therapy for breast cancer.   FAMILY HISTORY:  Is positive for breast cancer in that her mother and  sister have also had breast cancer.   SOCIAL HISTORY:  Reveals that she is divorced.  She has two children.  She does not use alcohol or tobacco.  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   PHYSICAL EXAM ON ADMISSION:  VITAL SIGNS:  Showed a blood pressure  135/74, heart rate 73.  CHEST:  The chest is clear.  HEART:  Reveals no murmur, gallop, rub or click.  ABDOMEN:  Is negative, no carotid bruits.  EXTREMITIES:  Show no  phlebitis or edema.   Her EKG shows normal sinus rhythm, nonspecific ST changes.  Her EKGs  have shown a right bundle branch block in the past but not at the  present time.  She had a previous echo in March 2008 showing normal LV  systolic function with diastolic dysfunction and minimal mitral valve  prolapse.   HOSPITAL COURSE:  The patient was admitted to telemetry.  She had  complaints of severe headache as well as vertigo.  The vertigo was  treated with meclizine.  The headache was treated with analgesics.  She  underwent a head CT scan without contrast which was normal.  On  March 26, 2007 she completed the workup with a carotid duplex which  showed no evidence of internal carotid artery stenosis and her vertebral  artery flow was antegrade.  Over the course of the hospital stay, her  severe vertigo resolved.  By the time of discharge she was ambulating in  the  hall without difficulty.  She had noted that she had stopped her  verapamil several weeks earlier because she felt it was not being  effective in controlling her palpitations.  Therefore we did not restart  the verapamil, but tried her on propranolol 20 mg b.i.d.  She tolerated  this well, except for low blood pressure and therefore at discharge we  are reducing the dosage of 10 mg b.i.d.   ACCESSORY LABORATORY DATA:  Hemoglobin 13.5, white count 5800, platelets  144,000.  Sodium 141, potassium 4.0, BUN 8, and creatinine 0.9, blood  sugar 113.  Coag studies were normal.  Liver function studies were  normal.  Cardiac enzymes were negative x3.  Calcium was 10.3.  Urinalysis was benign.   The patient is being discharged home improved on September 3.  She will  return to work on September 8.  She will be on a low sodium heart  healthy diet.   DISCHARGE MEDICINES:  1. Propranolol 10 mg twice a day.  2. Meclizine 25 mg every 8 hours as needed for vertigo.  3. Fosamax 70 mg weekly.  4. Calcium with vitamin D daily.  5.  Coated baby aspirin 81 mg daily.  6. Xanax 0.25 mg p.r.n. for nerves.   CONDITION ON DISCHARGE:  Improved.   She will be rechecked in the office in 2-3 weeks for a followup visit.           ______________________________  Cassell Clement, M.D.     TB/MEDQ  D:  03/27/2007  T:  03/27/2007  Job:  [id number]   cc:   Jillyn Hidden B. Truett Perna, M.D.

## 2010-12-06 NOTE — Assessment & Plan Note (Signed)
The patient remains on Enbrel once a week for control of her severe rheumatoid arthritis.  She is tolerating the therapy well without complications or side effects at this time.

## 2010-12-09 NOTE — Op Note (Signed)
   NAME:  DENA, ESPERANZA                           ACCOUNT NO.:  [id number]   MEDICAL RECORD NO.:  [id number]                   PATIENT TYPE:  AMB   LOCATION:  DSC                                  FACILITY:  [facility]   PHYSICIAN:  Currie Paris, M.D.           DATE OF BIRTH:  1940/08/23   DATE OF PROCEDURE:  12/26/2002  DATE OF DISCHARGE:                                 OPERATIVE REPORT   PREOPERATIVE DIAGNOSIS:  Unneeded Port-A-Cath.   POSTOPERATIVE DIAGNOSIS:  Unneeded Port-A-Cath.   PROCEDURE:  Removal of Port-A-Cath.   SURGEON:  Currie Paris, M.D.   ANESTHESIA:  Local.   CLINICAL HISTORY:  Ms. Clary has completed her chemotherapy and is now ready  to have her port removed.   DESCRIPTION OF PROCEDURE:  The patient was seen in the minor procedure room.  We confirmed the procedure as removal of her Port-A-Cath.  The area was  prepped with some Betadine and anesthetized with about 10 mL of 1% Xylocaine  with epinephrine.  I waited about 10 minutes for good hemostasis effect and  then went ahead and made an incision utilizing the old scar.  The Port-A-  Cath tubing was readily identified and backed up a short distance and then a  Vicryl suture placed around the tract.  Once the tubing was pulled out, the  suture was cut and tied down.  There were two holding Prolene sutures that  were cut from the reservoir and the reservoir removed from the pocket.  Everything appeared to be dry.   The incision was closed with some 3-0 Vicryl, followed by 4-0 Monocryl  subcuticular plus Dermabond.  The patient tolerated the procedure well with  no complications.                                               Currie Paris, M.D.    CJS/MEDQ  D:  12/26/2002  T:  12/26/2002  Job:  [id number]

## 2010-12-09 NOTE — Op Note (Signed)
NAME:  Cathy Leonard, Cathy Leonard                           ACCOUNT NO.:  [id number]   MEDICAL RECORD NO.:  [id number]                   PATIENT TYPE:  AMB   LOCATION:  DAY                                  FACILITY:  [facility]   PHYSICIAN:  Currie Paris, M.D.           DATE OF BIRTH:  [birth year]   DATE OF PROCEDURE:  08/27/2002  DATE OF DISCHARGE:                                 OPERATIVE REPORT   OFFICE MEDICAL RECORD NUMBER:  [id number]   PREOPERATIVE DIAGNOSIS:  Carcinoma of the breast.   POSTOPERATIVE DIAGNOSIS:  Carcinoma of the breast.   OPERATION:  Placement of Port-A-Cath for chemotherapy IV access.   SURGEON:  Currie Paris, M.D.   ANESTHESIA:  MAC.   CLINICAL HISTORY:  This patient is a 70 year old lady, who is getting ready  to have chemotherapy presurgery for breast cancer.  She needed adequate IV  access for her chemotherapy.   DESCRIPTION OF PROCEDURE:  The patient was seen in the holding area and had  no further questions.  She was taken into the operating room and given IV  sedation.  She was placed in Trendelenburg position, and the upper chest and  lower neck were prepped and draped as a single sterile field.   Xylocaine 1% was infiltrated into the right infraclavicular area and using  the X-port kit, we were able to enter the subclavian vein on the initial  attempt, and the guidewire threaded easily.  Fluoroscopy showed it to be in  the superior vena cava.   The patient was temporarily placed in a more flat position and additional  locally infiltrated to the anterior chest wall.  A transverse incision was  made and using cautery, a pocket fashioned for the X-port reservoir.  Local  was infiltrated from that point to the guidewire entry site, and the tubing  was brought through using the trocar tunneler.  This was flushed.  The  patient was placed back in Trendelenburg position.  The guidewire tract was  violated once with the dilator that came with the kit,  and the dilator and  guidewire were removed, and the catheter threaded easily through the peel-  away-sheath. The sheath was removed.  The catheter was situated at about 18  cm.  It aspirated and irrigated easily.  Fluoroscopy showed it to be in good  position in the superior vena cava.   The patient was placed back flat, and the _______catheter was attached to  the preflushed reservoir.  The reservoir locking mechanism was engaged.  The  reservoir was aspirated and then flushed with dilute heparin, and this  worked easily.   The incision was closed with some 3-0 Vicryl followed by 4-0 Monocryl.  The  reservoir was accessed with a right-angle Huber point needle and tubing and  flushed one more time with dilute heparin.  At this point, a final  fluoroscopy was made, and we looked  like we had good positioning, no  kinking, and good position of the tip.   Final closure of the incision was accomplished with some Dermabond.  The  common reservoir was flushed with concentrated heparin and locked.  Sterile  dressings were applied.  The patient tolerated the procedure well.  There  were no operative complications.  All counts were correct.                                               Currie Paris, M.D.    CJS/MEDQ  D:  08/27/2002  T:  08/27/2002  Job:  [id number]

## 2010-12-09 NOTE — Op Note (Signed)
NAME:  Cathy Leonard, Cathy Leonard                           ACCOUNT NO.:  [id number]   MEDICAL RECORD NO.:  [id number]                   PATIENT TYPE:  AMB   LOCATION:  DSC                                  FACILITY:  [facility]   PHYSICIAN:  Currie Paris, M.D.           DATE OF BIRTH:  [birth year]   DATE OF PROCEDURE:  12/08/2002  DATE OF DISCHARGE:                                 OPERATIVE REPORT   [facility] [phone].   PREOPERATIVE DIAGNOSIS:  Carcinoma, left breast upper outer quadrant.   POSTOPERATIVE DIAGNOSIS:  Carcinoma, left breast upper outer quadrant.   PROCEDURE:  Needle-guided excision, left breast cancer, with blue dye  injection and sentinel lymph node biopsy.   SURGEON:  Currie Paris, M.D.   ANESTHESIA:  General.   CLINICAL HISTORY:  This patient is a 70 year old lady with a carcinoma in  the about 12 to 1 o'clock position of the left breast.  She has been treated  with preoperative chemotherapy, and the mass has dramatically reduced and  was really not palpable.  After a lengthy discussion with the patient, she  was prepared to go to a needle-guided wide local excision at the area of the  primary as well as sentinel node biopsy/dissection.   DESCRIPTION OF PROCEDURE:  The patient was seen in the holding area and had  no further questions.  The guidewires were placed close to the  calcifications and entered one at about the 12 o'clock and one at about the  1 o'clock position, and they both traveled inferiorly and slightly  laterally.  The radioactive isotope had already been injected.  The patient  had no other questions.   She was taken to the operating room and after satisfactory general  anesthesia, some methylene blue was injected subareolarly on the left side.  This was massaged in for about five minutes.  The breast was prepped and  draped.  I used the Neoprobe and found a hot area in the axilla, which was  about in the midaxillary line at about the inferior margin  of the hair line.  A transverse incision was made here, and subcutaneous tissues were divided  and the axillary fat identified.  I saw a blue lymphatic, and brief  dissection showed a deep blue lymph node that was about a centimeter.  It  had counts of 5000 or higher and was excised.  A second hot node with counts  of about 1300 just posterior to this was identified and removed.  There were  no other hot nodes, palpable nodes, or blue nodes noted.   While waiting for the sentinel node, I went ahead and approached the breast.  Since both guidewires were traveling significantly inferiorly, I made a  curvilinear incision a centimeter below the lower of the two and about 2 cm  below the upper of the two.  I then went about 1 cm in the subcutaneous  tissues and then elevated a flap to manipulate both wires into the wound.  When that was done, I felt fairly comfortable my superior margin would be  well away from the tumor, so I divided the breast tissue basically straight  down to the chest wall along the entire superior margin of the excision.  I  then started around medially to work around, since again the guidewires were  traveling from medial to lateral, I felt this margin was going to be well  away.  Once I had these two sides freed up, I was able then to free the  tissue off of the chest wall, leaving just a little bit of fascia  posteriorly and a little bit of fatty tissue on the fascia.  I then freed up  the lateral edge, since this was basically oriented in a transverse fashion.  I then came superiorly and raised about a 1 cm flap of skin down to about  the areolar edge, and then divided through the breast tissue going deep to  meet up with the deep site where I had already undermined the breast tissue.  I felt that I was well around the guidewires and while there was some  sclerotic-appearing breast tissue, there was nothing that suggested tumor.  This was sent for specimen  localization, leaving a margin marker to localize  margins.   While waiting for this to come back, we irrigated to make sure everything  was dry.  I put two medium clips, one deep and one superficial, and then  some smaller clips to mark the medial, lateral, superior, and inferior  margins.  Bleeding was controlled with the cautery.  Pathology reported  initially that the sentinel node was negative, so I went ahead and injected  some Marcaine here and closed that incision in layers with 3-0 Vicryl,  followed by 4-0 Monocryl.   As I was preparing to close the breast incision, Dr. Rutherford Lions reported that the  anterior margin was very close to the calcifications.  The rest of the  margins looked widely away.  Since my anterior margin was basically in the  subcu, I elected to take about a 1 cm wide edge of skin from the inferior  part of the incision.  I made that incision and then using that to elevate,  I re-excised the entire length of the inferior margin from the medial edge  to the lateral edge down essentially to the chest wall so that in addition  to an anterior margin, I also had a long inferior margin, since this is  where the tip of the guidewire was going to be the closest and thought this  would be more likely to assure a negative margin.   Then bleeders were controlled with either cautery or a couple of sutures.  The clips were checked and appeared to be okay.  I closed a little bit of  the breast tissue over the muscle and then closed the subcu, leaving  something of a cavity to fill in since I felt that would produce less  deformity.  Since there was a little width on the skin because of the  incision, I closed the skin with some staples rather than Monocryl.   The patient tolerated the procedure well.  There were no operative  complications.  All counts were correct.  Currie Paris, M.D.   CJS/MEDQ  D:  12/08/2002  T:   12/09/2002  Job:  [id number]   cc:   Ilda Mori, M.D.  [address]  [place], [place] [address]  Fax: [phone]   Merilynn Finland, M.D.  [address] [place] [facility]  [place]  [place] [address]  Fax: [phone]

## 2010-12-31 ENCOUNTER — Other Ambulatory Visit: Payer: Self-pay | Admitting: Cardiology

## 2011-01-02 NOTE — Telephone Encounter (Signed)
Med refill

## 2011-01-03 ENCOUNTER — Ambulatory Visit (INDEPENDENT_AMBULATORY_CARE_PROVIDER_SITE_OTHER): Payer: Medicare Other | Admitting: *Deleted

## 2011-01-03 DIAGNOSIS — I2699 Other pulmonary embolism without acute cor pulmonale: Secondary | ICD-10-CM

## 2011-01-03 DIAGNOSIS — Z7901 Long term (current) use of anticoagulants: Secondary | ICD-10-CM

## 2011-01-17 ENCOUNTER — Ambulatory Visit (INDEPENDENT_AMBULATORY_CARE_PROVIDER_SITE_OTHER): Payer: Medicare Other | Admitting: *Deleted

## 2011-01-17 DIAGNOSIS — I2699 Other pulmonary embolism without acute cor pulmonale: Secondary | ICD-10-CM

## 2011-01-17 DIAGNOSIS — Z7901 Long term (current) use of anticoagulants: Secondary | ICD-10-CM

## 2011-01-31 ENCOUNTER — Ambulatory Visit (INDEPENDENT_AMBULATORY_CARE_PROVIDER_SITE_OTHER): Payer: Medicare Other | Admitting: *Deleted

## 2011-01-31 DIAGNOSIS — I2699 Other pulmonary embolism without acute cor pulmonale: Secondary | ICD-10-CM

## 2011-02-21 ENCOUNTER — Encounter: Payer: Self-pay | Admitting: Cardiology

## 2011-03-02 ENCOUNTER — Telehealth: Payer: Self-pay | Admitting: Cardiology

## 2011-03-02 NOTE — Telephone Encounter (Signed)
Called today with a question about something Dr. Patty Sermons told her on her last visit. Patient did not wish to disclose. Please call back.

## 2011-03-03 NOTE — Telephone Encounter (Signed)
Patient called today wanting to know why she never received a call back from yesterday.  I never received message and apologized to patient.  She explained that she had about "3 to 3 &1/2 hours of her heart fluttering".  She said  Dr. Patty Sermons had advised for her to call if this happened for a long period of time. Stated she woke up feeling this way and took her regular medications.  Denied any pain.  This was the longest episode she has ever had.  No fluttering since then. She did see her PCP yesterday afternoon for some stomach and back pain she has been having.  She was put on antibiotics and labs done.  Her INR was high and is going back today for a recheck.  Patient has appointment next with  Dr. Patty Sermons will keep appointment and if happens again to call on call MD for direction.

## 2011-03-07 ENCOUNTER — Ambulatory Visit (INDEPENDENT_AMBULATORY_CARE_PROVIDER_SITE_OTHER): Payer: Medicare Other | Admitting: *Deleted

## 2011-03-07 ENCOUNTER — Encounter: Payer: Self-pay | Admitting: Cardiology

## 2011-03-07 ENCOUNTER — Ambulatory Visit (INDEPENDENT_AMBULATORY_CARE_PROVIDER_SITE_OTHER): Payer: Medicare Other | Admitting: Cardiology

## 2011-03-07 DIAGNOSIS — Z7901 Long term (current) use of anticoagulants: Secondary | ICD-10-CM

## 2011-03-07 DIAGNOSIS — I2699 Other pulmonary embolism without acute cor pulmonale: Secondary | ICD-10-CM

## 2011-03-07 DIAGNOSIS — I059 Rheumatic mitral valve disease, unspecified: Secondary | ICD-10-CM

## 2011-03-07 DIAGNOSIS — R109 Unspecified abdominal pain: Secondary | ICD-10-CM | POA: Insufficient documentation

## 2011-03-07 NOTE — Assessment & Plan Note (Signed)
For the past week the patient has had fairly severe intense lower abdominal pain.  The pain started after she tried to lift a window frame.  Initially she had back pain which then moved around to the lower anterior abdomen.  The pain is worse if she twists.  It also worse if she is also worse if she leans over.  She has not been having any fever or chills.  She has had some mild loose stools which she attributes to the antibiotics that she was placed on earlier this week by her primary care provider for suspected diverticulitis.  She has not had a CT scan of her belly.

## 2011-03-07 NOTE — Assessment & Plan Note (Signed)
Patient has mild mitral valve prolapse.  Her last echocardiogram on 01/07/09 and showed mitral valve prolapse with mild mitral regurgitation and normal LV function with impaired relaxation.  She's had no recent chest pain or increased palpitations dizziness or syncope.

## 2011-03-07 NOTE — Progress Notes (Signed)
Cathy Leonard Date of Birth:  18-Aug-1940 [facility] [address]   [address] [place], [place]  [address] [phone]           Fax   [phone]  History of Present Illness: This pleasant middle-aged Caucasian female is seen for a followup office visit.  She has a complex past medical history.  She has a history of a previous pulmonary embolus with pulmonary infarction.  No source for the embolus was found.  She essentially underwent a workup for hypercoagulable state by hematology and was found to have a hypercoagulable profile and will need to stay on lifelong Coumadin.  She has not had any recent chest pain or shortness of breath to suggest recurrent pulmonary emboli.  She also has a past history of mitral valve prolapse and history of aortic valve sclerosis.  Recently she's had problems with lower abdominal discomfort for the past week.  The patient also has a history of significant rheumatoid arthritis and is onEnbrel injections once a week.  Current Outpatient Prescriptions  Medication Sig Dispense Refill  . alendronate (FOSAMAX) 70 MG tablet Take 70 mg by mouth every 7 (seven) days. Take in the morning with a full glass of water, on an empty stomach, and do not take anything else by mouth or lie down for the next 30 min.       Marland Kitchen ALPRAZolam (XANAX) 0.25 MG tablet Take 0.25 mg by mouth 3 (three) times daily.        . Calcium Carbonate-Vitamin D (CALCIUM + D PO) Take 600 mg by mouth daily.       . Cholecalciferol (VITAMIN D PO) Take 2,000 Units by mouth daily.        . ciprofloxacin (CIPRO) 500 MG tablet Take 500 mg by mouth 2 (two) times daily.       . digoxin (LANOXIN) 0.25 MG tablet Take 250 mcg by mouth daily. 1/2 daily        . etanercept (ENBREL) 50 MG/ML injection Inject 50 mg into the skin once a week.       . metroNIDAZOLE (FLAGYL) 500 MG tablet Take 500 mg by mouth 2 (two) times daily.       . Multiple Vitamins-Minerals (EYE-VITES) TABS Take by mouth  daily.        . propranolol (INDERAL) 20 MG tablet TAKE 1 TABLET TWICE A DAY  180 tablet  2  . warfarin (COUMADIN) 5 MG tablet Take 5 mg by mouth daily.          No Known Allergies  Patient Active Problem List  Diagnoses  . ANXIETY STATE, UNSPECIFIED  . MITRAL VALVE PROLAPSE  . ROSACEA  . GENERALIZED OSTEOARTHROSIS INVOLVING HAND  . BACK PAIN, ACUTE  . HAND PAIN  . PERSONAL HISTORY OF MALIGNANT NEOPLASM OF BREAST  . Pulmonary embolism  . Long term current use of anticoagulant  . Palpitations  . Rheumatoid arthritis  . Vitiligo    History  Smoking status  . Never Smoker   Smokeless tobacco  . Not on file    History  Alcohol Use: Not on file    No family history on file.  Review of Systems: Constitutional: no fever chills diaphoresis or fatigue or change in weight.  Head and neck: no hearing loss, no epistaxis, no photophobia or visual disturbance. Respiratory: No cough, shortness of breath or wheezing. Cardiovascular: No chest pain peripheral edema, palpitations. Gastrointestinal: No abdominal distention, no abdominal pain, no change  in bowel habits hematochezia or melena. Genitourinary: No dysuria, no frequency, no urgency, no nocturia. Musculoskeletal:No arthralgias, no back pain, no gait disturbance or myalgias. Neurological: No dizziness, no headaches, no numbness, no seizures, no syncope, no weakness, no tremors. Hematologic: No lymphadenopathy, no easy bruising. Psychiatric: No confusion, no hallucinations, no sleep disturbance.    Physical Exam: Filed Vitals:   03/07/11 1440  BP: 110/60  Pulse: 72  The general appearance feels a well-developed well-nourished woman in no distress.Pupils equal and reactive.   Extraocular Movements are full.  There is no scleral icterus.  The mouth and pharynx are normal.  The neck is supple.  The carotids reveal no bruits.  The jugular venous pressure is normal.  The thyroid is not enlarged.  There is no  lymphadenopathy.  The chest is clear to percussion and auscultation. There are no rales or rhonchi. Expansion of the chest is symmetrical.    The heart reveals a soft apical systolic murmur.  The abdomen reveals active bowel sounds.  The mild tenderness is in the lower quadrants bilaterally but more on the right than the left.  There is no guarding.  There is no rebound extremities show no phlebitis or edema.Strength is normal and symmetrical in all extremities.  There is no lateralizing weakness.  There are no sensory deficits.     Assessment / Plan: Abdominal pain which may be musculoskeletal strain of the rectus abdominis muscles.  However if her symptoms persist she may need a CT scan of the abdomen for completeness.  She is due to see her primary care provider again later this week for followup.  She remains on antibiotics at this time.  We will plan to see her back in several months for followup office visit here.  If she has any further palpitations or sustained tachycardia she should call us right away

## 2011-03-07 NOTE — Assessment & Plan Note (Signed)
Today the prothrombin time is elevated with an INR of 3.7.  She has been on recent antibiotics which probably account for this.  Also because of her lower abdominal pain she has not been eating as much.  His not been experiencing any hemorrhage or bleeding from the elevated prothrombin time.  I gave her instructions about how to cut back on her Coumadin for as long as she has to remain on the antibiotics.

## 2011-03-10 ENCOUNTER — Other Ambulatory Visit: Payer: Self-pay | Admitting: Family Medicine

## 2011-03-10 ENCOUNTER — Ambulatory Visit
Admission: RE | Admit: 2011-03-10 | Discharge: 2011-03-10 | Disposition: A | Discharge status: Home / Self Care | Payer: Medicare Other | Source: Ambulatory Visit | Attending: Family Medicine | Admitting: Family Medicine

## 2011-03-10 DIAGNOSIS — R109 Unspecified abdominal pain: Secondary | ICD-10-CM

## 2011-03-10 MED ORDER — IOHEXOL 300 MG/ML  SOLN
100.0000 mL | Freq: Once | INTRAMUSCULAR | Status: AC | PRN
  Administered 2011-03-10: 100 mL via INTRAVENOUS

## 2011-04-19 LAB — DIFFERENTIAL
Eosinophils Absolute: 0.2
Lymphocytes Relative: 22
Lymphs Abs: 1.6
Monocytes Relative: 9
Neutrophils Relative %: 66

## 2011-04-19 LAB — CBC
MCV: 95.8
RBC: 4.26
WBC: 7.1

## 2011-04-19 LAB — SEDIMENTATION RATE: Sed Rate: 15

## 2011-04-25 LAB — BASIC METABOLIC PANEL
CO2: 25
Calcium: 8.1 — ABNORMAL LOW
Chloride: 108
GFR calc Af Amer: >60
Glucose, Bld: 127 — ABNORMAL HIGH
Potassium: 4
Sodium: 138

## 2011-04-25 LAB — URINALYSIS, ROUTINE W REFLEX MICROSCOPIC
Bilirubin Urine: NEGATIVE
Ketones, ur: NEGATIVE
Nitrite: NEGATIVE
Protein, ur: 30 — AB
Urobilinogen, UA: 1

## 2011-04-25 LAB — DIFFERENTIAL
Basophils Relative: 1
Eosinophils Relative: 0
Monocytes Absolute: 1.1 — ABNORMAL HIGH
Monocytes Relative: 9
Neutro Abs: 10.6 — ABNORMAL HIGH

## 2011-04-25 LAB — COMPREHENSIVE METABOLIC PANEL
AST: 16
Albumin: 2.9 — ABNORMAL LOW
Alkaline Phosphatase: 45
BUN: 12
CO2: 26
Chloride: 102
GFR calc non Af Amer: 56 — ABNORMAL LOW
Potassium: 3.6
Total Bilirubin: 1.7 — ABNORMAL HIGH

## 2011-04-25 LAB — PREGNANCY, URINE: Preg Test, Ur: NEGATIVE

## 2011-04-25 LAB — CBC
HCT: 35.7 — ABNORMAL LOW
HCT: 42.3
Hemoglobin: 12.1
Hemoglobin: 14.5
MCHC: 33.9
MCHC: 34.3
MCV: 98.6
MCV: 99.6
RBC: 3.59 — ABNORMAL LOW
RBC: 4.29
RDW: 14.2
RDW: 15.2

## 2011-04-25 LAB — POCT CARDIAC MARKERS
CKMB, poc: <1 — ABNORMAL LOW
Myoglobin, poc: 42.2
Troponin i, poc: <0.05

## 2011-04-25 LAB — APTT: aPTT: 31

## 2011-04-25 LAB — URINE MICROSCOPIC-ADD ON

## 2011-04-25 LAB — CK TOTAL AND CKMB (NOT AT ARMC): CK, MB: 0.4

## 2011-05-05 LAB — URINALYSIS, ROUTINE W REFLEX MICROSCOPIC
Glucose, UA: NEGATIVE
Hgb urine dipstick: NEGATIVE
Ketones, ur: NEGATIVE
Protein, ur: NEGATIVE
Urobilinogen, UA: 0.2

## 2011-05-05 LAB — COMPREHENSIVE METABOLIC PANEL
Albumin: 3.6
BUN: 8
CO2: 27
Chloride: 107
Creatinine, Ser: 0.9
GFR calc non Af Amer: >60
Total Bilirubin: 0.8

## 2011-05-05 LAB — I-STAT 8, (EC8 V) (CONVERTED LAB)
Acid-Base Excess: 3 — ABNORMAL HIGH
BUN: 11
Bicarbonate: 27.1 — ABNORMAL HIGH
Chloride: 106
HCT: 42
Hemoglobin: 14.3
Operator id: 151321
Sodium: 138
pCO2, Ven: 38.4 — ABNORMAL LOW

## 2011-05-05 LAB — DIFFERENTIAL
Basophils Absolute: 0
Lymphocytes Relative: 18
Neutro Abs: 4.4

## 2011-05-05 LAB — PROTIME-INR
INR: 1
Prothrombin Time: 13.1

## 2011-05-05 LAB — CBC
HCT: 39.8
MCV: 95
Platelets: 144 — ABNORMAL LOW
RDW: 13.4

## 2011-05-05 LAB — POCT CARDIAC MARKERS: CKMB, poc: <1 — ABNORMAL LOW

## 2011-05-05 LAB — POCT I-STAT CREATININE: Creatinine, Ser: 0.9

## 2011-05-05 LAB — CARDIAC PANEL(CRET KIN+CKTOT+MB+TROPI)
CK, MB: 0.8
CK, MB: 0.8
Total CK: 42
Total CK: 42
Troponin I: 0.01

## 2011-05-05 LAB — CK TOTAL AND CKMB (NOT AT ARMC): Relative Index: INVALID

## 2011-05-05 LAB — TROPONIN I: Troponin I: 0.02

## 2011-05-11 ENCOUNTER — Other Ambulatory Visit: Payer: Self-pay | Admitting: Cardiology

## 2011-05-11 NOTE — Telephone Encounter (Signed)
Refilled digoxin 

## 2011-05-26 ENCOUNTER — Other Ambulatory Visit: Payer: Self-pay | Admitting: *Deleted

## 2011-05-29 ENCOUNTER — Other Ambulatory Visit: Payer: Self-pay | Admitting: Cardiology

## 2011-05-29 DIAGNOSIS — F419 Anxiety disorder, unspecified: Secondary | ICD-10-CM

## 2011-05-29 MED ORDER — ALPRAZOLAM 0.5 MG PO TABS: 0.5000 mg | ORAL_TABLET | Freq: Four times a day (QID) | ORAL | Status: DC | PRN

## 2011-05-29 NOTE — Telephone Encounter (Signed)
Pt returning call back to nurse regarding xanax was told to pick up today.

## 2011-05-29 NOTE — Telephone Encounter (Signed)
Spoke with patient and she stated her Xanax was increased to .5 mg every 6 hours as needed as requested by pharmacy.  Sent to [facility]

## 2011-05-29 NOTE — Telephone Encounter (Signed)
Patient returning call back to nurse.

## 2011-05-29 NOTE — Telephone Encounter (Signed)
Rx was actually phone in to [facility] [phone] instead of [facility]

## 2011-06-07 ENCOUNTER — Ambulatory Visit (INDEPENDENT_AMBULATORY_CARE_PROVIDER_SITE_OTHER): Payer: Medicare Other | Admitting: Cardiology

## 2011-06-07 ENCOUNTER — Encounter: Payer: Self-pay | Admitting: Cardiology

## 2011-06-07 VITALS — BP 120/78 | HR 78 | Ht 65.0 in | Wt 128.0 lb

## 2011-06-07 DIAGNOSIS — R002 Palpitations: Secondary | ICD-10-CM

## 2011-06-07 DIAGNOSIS — M069 Rheumatoid arthritis, unspecified: Secondary | ICD-10-CM

## 2011-06-07 DIAGNOSIS — I341 Nonrheumatic mitral (valve) prolapse: Secondary | ICD-10-CM

## 2011-06-07 DIAGNOSIS — I059 Rheumatic mitral valve disease, unspecified: Secondary | ICD-10-CM

## 2011-06-07 DIAGNOSIS — R918 Other nonspecific abnormal finding of lung field: Secondary | ICD-10-CM

## 2011-06-07 DIAGNOSIS — R9389 Abnormal findings on diagnostic imaging of other specified body structures: Secondary | ICD-10-CM

## 2011-06-07 DIAGNOSIS — Z7901 Long term (current) use of anticoagulants: Secondary | ICD-10-CM

## 2011-06-07 NOTE — Patient Instructions (Signed)
Go for your follow up chest xray mid December and will call you with the results Will obtain labs today and call with results Your physician recommends that you schedule a follow-up appointment in: 4 months for office visit and EKG

## 2011-06-07 NOTE — Assessment & Plan Note (Signed)
The patient has a history of severe rheumatoid arthritis and is on Enbrel

## 2011-06-07 NOTE — Assessment & Plan Note (Signed)
Her palpitations are of relatively recent onset intend to occur more in the evening when she is in bed

## 2011-06-07 NOTE — Progress Notes (Signed)
Cathy Leonard Date of Birth:  May 10, 1941 [facility] [address]   [address] [place], [place]  [address] [phone]           Fax   [phone]  History of Present Illness: This pleasant 70 year old woman is seen for a scheduled followup office visit.  She has a history of previous pulmonary embolus from pulmonary infarction.  No source of the embolus was found.  She had a hematology workup which found a hypercoagulable profile.  She was advised to stay on lifelong Coumadin.  Her Coumadin [facility] [employer] [facility] by Dr. Corliss Blacker.  Patient has been experiencing some palpitations.  A pharmacist friend suggested that she have her digoxin level checked we are checking that today.  She takes a very small amount of digoxin daily.  Current Outpatient Prescriptions  Medication Sig Dispense Refill  . alendronate (FOSAMAX) 70 MG tablet Take 70 mg by mouth every 7 (seven) days. Take in the morning with a full glass of water, on an empty stomach, and do not take anything else by mouth or lie down for the next 30 min.       Marland Kitchen ALPRAZolam (XANAX) 0.5 MG tablet Take 1 tablet (0.5 mg total) by mouth 4 (four) times daily as needed for sleep.  100 tablet  1  . Calcium Carbonate-Vitamin D (CALCIUM + D PO) Take 600 mg by mouth daily.       . Cholecalciferol (VITAMIN D PO) Take 2,000 Units by mouth daily.        . digoxin (LANOXIN) 0.25 MG tablet TAKE ONE-HALF (1/2) TABLET  DAILY  45 tablet  3  . etanercept (ENBREL) 50 MG/ML injection Inject 50 mg into the skin once a week.       . Multiple Vitamins-Minerals (EYE-VITES) TABS Take by mouth daily.        . propranolol (INDERAL) 20 MG tablet TAKE 1 TABLET TWICE A DAY  180 tablet  2  . warfarin (COUMADIN) 5 MG tablet Take 5 mg by mouth daily.          No Known Allergies  Patient Active Problem List  Diagnoses  . ANXIETY STATE, UNSPECIFIED  . MITRAL VALVE PROLAPSE  . ROSACEA  . GENERALIZED OSTEOARTHROSIS INVOLVING  HAND  . BACK PAIN, ACUTE  . HAND PAIN  . PERSONAL HISTORY OF MALIGNANT NEOPLASM OF BREAST  . Pulmonary embolism  . Long term current use of anticoagulant  . Palpitations  . Rheumatoid arthritis  . Vitiligo  . Abdominal pain    History  Smoking status  . Never Smoker   Smokeless tobacco  . Not on file    History  Alcohol Use: Not on file    No family history on file.  Review of Systems: Constitutional: no fever chills diaphoresis or fatigue or change in weight.  Head and neck: no hearing loss, no epistaxis, no photophobia or visual disturbance. Respiratory: No cough, shortness of breath or wheezing. Cardiovascular: No chest pain peripheral edema, palpitations. Gastrointestinal: No abdominal distention, no abdominal pain, no change in bowel habits hematochezia or melena. Genitourinary: No dysuria, no frequency, no urgency, no nocturia. Musculoskeletal:No arthralgias, no back pain, no gait disturbance or myalgias. Neurological: No dizziness, no headaches, no numbness, no seizures, no syncope, no weakness, no tremors. Hematologic: No lymphadenopathy, no easy bruising. Psychiatric: No confusion, no hallucinations, no sleep disturbance.    Physical Exam: Filed Vitals:   06/07/11 1515  BP: 120/78  Pulse:  78  The patient appears to be in no distress.  Head and neck exam reveals that the pupils are equal and reactive.  The extraocular movements are full.  There is no scleral icterus.  Mouth and pharynx are benign.  No lymphadenopathy.  No carotid bruits.  The jugular venous pressure is normal.  Thyroid is not enlarged or tender.  Chest is clear to percussion and auscultation.  No rales or rhonchi.  Expansion of the chest is symmetrical.  Heart reveals no abnormal lift or heave.  First and second heart sounds are normal.  There is no murmur gallop rub or click.  The abdomen is soft and nontender.  Bowel sounds are normoactive.  There is no hepatosplenomegaly or mass.  There  are no abdominal bruits.  Extremities reveal no phlebitis or edema.  Pedal pulses are good.  There is no cyanosis or clubbing.  He does have bony deformities consistent with rheumatoid arthritis of the hands  Neurologic exam is normal strength and no lateralizing weakness.  No sensory deficits.  Integument reveals no rash    Assessment / Plan: We are checking a digoxin level today.  She had a CT scan in August and they recommended a followup chest x-ray in 4 months to assure stability.  We will plan to do that in mid December.  Will plan to see her for a four-month followup office visit with EKG

## 2011-06-07 NOTE — Assessment & Plan Note (Signed)
The patient has had no further symptoms of pulmonary embolus or pulmonary infarction.  She does have a known abnormality at the base of her right lung on x-ray

## 2011-06-08 LAB — DIGOXIN LEVEL: Digoxin Level: 0.6 ng/mL — ABNORMAL LOW (ref 0.8–2.0)

## 2011-06-13 ENCOUNTER — Telehealth: Payer: Self-pay | Admitting: *Deleted

## 2011-06-13 NOTE — Telephone Encounter (Signed)
Message copied by Burnell Blanks on Tue Jun 13, 2011  1:17 PM ------      Message from: Cassell Clement      Created: Sun Jun 11, 2011  8:46 PM       Digoxin level low.  No sign of toxicity.  CSD.

## 2011-06-13 NOTE — Telephone Encounter (Signed)
Advised of results

## 2011-06-20 ENCOUNTER — Other Ambulatory Visit: Payer: Self-pay | Admitting: Family Medicine

## 2011-06-20 DIAGNOSIS — R9389 Abnormal findings on diagnostic imaging of other specified body structures: Secondary | ICD-10-CM

## 2011-07-10 ENCOUNTER — Ambulatory Visit
Admission: RE | Admit: 2011-07-10 | Discharge: 2011-07-10 | Disposition: A | Discharge status: Home / Self Care | Payer: Medicare Other | Source: Ambulatory Visit | Attending: Family Medicine | Admitting: Family Medicine

## 2011-07-10 DIAGNOSIS — R9389 Abnormal findings on diagnostic imaging of other specified body structures: Secondary | ICD-10-CM

## 2011-07-10 MED ORDER — IOHEXOL 300 MG/ML  SOLN
75.0000 mL | Freq: Once | INTRAMUSCULAR | Status: AC | PRN
  Administered 2011-07-10: 75 mL via INTRAVENOUS

## 2011-08-29 ENCOUNTER — Other Ambulatory Visit: Payer: Self-pay | Admitting: Family Medicine

## 2011-08-29 DIAGNOSIS — Z1231 Encounter for screening mammogram for malignant neoplasm of breast: Secondary | ICD-10-CM

## 2011-09-07 ENCOUNTER — Ambulatory Visit
Admission: RE | Admit: 2011-09-07 | Discharge: 2011-09-07 | Disposition: A | Discharge status: Home / Self Care | Payer: Medicare Other | Source: Ambulatory Visit | Attending: Family Medicine | Admitting: Family Medicine

## 2011-09-07 DIAGNOSIS — Z1231 Encounter for screening mammogram for malignant neoplasm of breast: Secondary | ICD-10-CM

## 2011-10-03 ENCOUNTER — Encounter: Payer: Self-pay | Admitting: Cardiology

## 2011-10-03 ENCOUNTER — Ambulatory Visit (INDEPENDENT_AMBULATORY_CARE_PROVIDER_SITE_OTHER): Payer: Medicare Other | Admitting: Cardiology

## 2011-10-03 VITALS — BP 118/74 | HR 75 | Ht 65.0 in | Wt 130.8 lb

## 2011-10-03 DIAGNOSIS — I059 Rheumatic mitral valve disease, unspecified: Secondary | ICD-10-CM

## 2011-10-03 DIAGNOSIS — Z7901 Long term (current) use of anticoagulants: Secondary | ICD-10-CM

## 2011-10-03 DIAGNOSIS — R002 Palpitations: Secondary | ICD-10-CM

## 2011-10-03 NOTE — Assessment & Plan Note (Signed)
Recently the patient has been troubled with frequent palpitations.  These are usually worse between supper and bedtime.  They do not occur every day.  She has also had episodes of dizziness which did not necessarily coincide with the sensation of palpitations.  We'll place an event monitor

## 2011-10-03 NOTE — Assessment & Plan Note (Signed)
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

## 2011-10-03 NOTE — Progress Notes (Signed)
Cathy Leonard Date of Birth:  Jan 17, 1941 [facility] [address] [place], [place]  [address] [phone]         Fax   [phone]  History of Present Illness: This pleasant 71 year old woman is seen for a scheduled four-month followup office visit.  She has a history of having had a pulmonary embolus in 2009.  She does not have any history of ischemic heart disease.  She had a normal Myoview treadmill stress test in 2008.  She has been on long-term Coumadin since her pulmonary embolism.  She does not have any history of known atrial fibrillation.  Recently she has been experiencing increased palpitations.  She does drink moderate caffeine in the form of 2 regular coca colas daily.  She has a history of hypercholesterolemia.  Current Outpatient Prescriptions  Medication Sig Dispense Refill  . acetaminophen (TYLENOL ARTHRITIS PAIN) 650 MG CR tablet Take 650 mg by mouth as needed.      Marland Kitchen alendronate (FOSAMAX) 70 MG tablet Take 70 mg by mouth every 7 (seven) days. Take in the morning with a full glass of water, on an empty stomach, and do not take anything else by mouth or lie down for the next 30 min.       Marland Kitchen ALPRAZolam (XANAX) 0.5 MG tablet Take 0.5 mg by mouth as needed.      . Biotin 2500 MCG CAPS Take 2,500 mcg by mouth daily.      . Calcium Carbonate-Vitamin D (CALCIUM + D PO) Take 600 mg by mouth daily.       . Cholecalciferol (VITAMIN D PO) Take 2,000 Units by mouth daily.        . digoxin (LANOXIN) 0.25 MG tablet TAKE ONE-HALF (1/2) TABLET  DAILY  45 tablet  3  . etanercept (ENBREL) 50 MG/ML injection Inject 50 mg into the skin once a week.       . folic acid (FOLVITE) 400 MCG tablet Take 400 mcg by mouth 2 (two) times daily.      . Multiple Vitamins-Minerals (EYE-VITES) TABS Take by mouth daily.        . propranolol (INDERAL) 20 MG tablet TAKE 1 TABLET TWICE A DAY  180 tablet  2  . warfarin (COUMADIN) 5 MG tablet Take 5 mg by mouth daily. 5 mg 6 days a week and  1/2 tablet on thursday      . DISCONTD: ALPRAZolam (XANAX) 0.5 MG tablet Take 1 tablet (0.5 mg total) by mouth 4 (four) times daily as needed for sleep.  100 tablet  1    No Known Allergies  Patient Active Problem List  Diagnoses  . ANXIETY STATE, UNSPECIFIED  . MITRAL VALVE PROLAPSE  . ROSACEA  . GENERALIZED OSTEOARTHROSIS INVOLVING HAND  . BACK PAIN, ACUTE  . HAND PAIN  . PERSONAL HISTORY OF MALIGNANT NEOPLASM OF BREAST  . Pulmonary embolism  . Long term current use of anticoagulant  . Palpitations  . Rheumatoid arthritis  . Vitiligo  . Abdominal pain    History  Smoking status  . Never Smoker   Smokeless tobacco  . Not on file    History  Alcohol Use: Not on file    No family history on file.  Review of Systems: Constitutional: no fever chills diaphoresis or fatigue or change in weight.  Head and neck: no hearing loss, no epistaxis, no photophobia or visual disturbance. Respiratory: No cough, shortness of breath or wheezing. Cardiovascular: No chest pain peripheral edema,  palpitations. Gastrointestinal: No abdominal distention, no abdominal pain, no change in bowel habits hematochezia or melena. Genitourinary: No dysuria, no frequency, no urgency, no nocturia. Musculoskeletal:No arthralgias, no back pain, no gait disturbance or myalgias. Neurological: No dizziness, no headaches, no numbness, no seizures, no syncope, no weakness, no tremors. Hematologic: No lymphadenopathy, no easy bruising. Psychiatric: No confusion, no hallucinations, no sleep disturbance.    Physical Exam: Filed Vitals:   10/03/11 1532  BP: 118/74  Pulse: 75   the general appearance reveals a well-developed well-nourished woman in no distress.Pupils equal and reactive.   Extraocular Movements are full.  There is no scleral icterus.  The mouth and pharynx are normal.  The neck is supple.  The carotids reveal no bruits.  The jugular venous pressure is normal.  The thyroid is not enlarged.   There is no lymphadenopathy.  The chest is clear to percussion and auscultation. There are no rales or rhonchi. Expansion of the chest is symmetrical.  The heart reveals a questionable midsystolic click.  The rhythm is regularThe abdomen is soft and nontender. Bowel sounds are normal. The liver and spleen are not enlarged. There Are no abdominal masses. There are no bruits.  Extremities show no phlebitis or edema.  Does have changes of rheumatoid arthritis The skin is warm and dry.  There is no rash. Strength is normal and symmetrical in all extremities.  There is no lateralizing weakness.  There are no sensory deficits.  Her EKG shows normal sinus rhythm and nonspecific ST-T wave change   Assessment / Plan: We will wear an event monitor.  This was started today.  We will try to identify her arrhythmia and then determine what the best course of treatment will be.  She was also advised to cut down on her Coca-Cola which contains caffeine.  Recheck in 4 months for followup office visit

## 2011-10-03 NOTE — Patient Instructions (Signed)
Your physician recommends that you continue on your current medications as directed. Please refer to the Current Medication list given to you today.  Your physician wants you to follow-up in: 4 months You will receive a reminder letter in the mail two months in advance. If you don't receive a letter,  please call our office to schedule the follow-up appointment.   Your physician has recommended that you wear an event monitor. Event monitors are medical devices that record the heart's electrical activity. Doctors most often [place] these monitors to diagnose arrhythmias. Arrhythmias are problems with the speed or rhythm of the heartbeat. The monitor is a small, portable device. You can wear one while you do your normal daily activities. This is usually used to diagnose what is causing palpitations/syncope (passing out).

## 2011-10-03 NOTE — Assessment & Plan Note (Signed)
The patient has a past history of suspected mitral valve prolapse.  Her last echocardiogram on 01/09/09 showed normal left ventricular systolic function with impaired relaxation and mild aortic sclerosis without stenosis and mitral valve prolapse with mild mitral regurgitation.  She had normal pulmonary artery pressure.

## 2011-11-01 ENCOUNTER — Other Ambulatory Visit: Payer: Self-pay

## 2011-11-01 MED ORDER — PROPRANOLOL HCL 20 MG PO TABS: 20.0000 mg | ORAL_TABLET | Freq: Two times a day (BID) | ORAL | Status: DC

## 2011-11-09 ENCOUNTER — Telehealth: Payer: Self-pay | Admitting: Cardiology

## 2011-11-09 MED ORDER — PROPRANOLOL HCL 20 MG PO TABS: 20.0000 mg | ORAL_TABLET | Freq: Two times a day (BID) | ORAL | Status: DC

## 2011-11-09 NOTE — Telephone Encounter (Signed)
New problem:     Patient called in because she is having trouble getting her propranolol (INDERAL) 20 MG tablet refilled.  The pharmacy does not know the directions related to that medication and need someone to call that in.

## 2011-11-09 NOTE — Telephone Encounter (Signed)
Refilled propranolol and notified patient

## 2011-12-25 ENCOUNTER — Other Ambulatory Visit: Payer: Self-pay | Admitting: *Deleted

## 2011-12-25 MED ORDER — ALPRAZOLAM 0.5 MG PO TABS: 0.5000 mg | ORAL_TABLET | Freq: Four times a day (QID) | ORAL | Status: DC | PRN

## 2011-12-25 NOTE — Telephone Encounter (Signed)
Confirmed dose of xanax, refill completed by phone in

## 2012-01-15 ENCOUNTER — Encounter: Payer: Self-pay | Admitting: Cardiology

## 2012-03-29 ENCOUNTER — Encounter: Payer: Self-pay | Admitting: Cardiology

## 2012-03-29 ENCOUNTER — Ambulatory Visit (INDEPENDENT_AMBULATORY_CARE_PROVIDER_SITE_OTHER): Payer: Medicare Other | Admitting: Cardiology

## 2012-03-29 VITALS — BP 118/70 | HR 80 | Ht 65.0 in | Wt 128.0 lb

## 2012-03-29 DIAGNOSIS — I2699 Other pulmonary embolism without acute cor pulmonale: Secondary | ICD-10-CM

## 2012-03-29 DIAGNOSIS — Z7901 Long term (current) use of anticoagulants: Secondary | ICD-10-CM

## 2012-03-29 DIAGNOSIS — M069 Rheumatoid arthritis, unspecified: Secondary | ICD-10-CM

## 2012-03-29 DIAGNOSIS — R002 Palpitations: Secondary | ICD-10-CM

## 2012-03-29 NOTE — Assessment & Plan Note (Signed)
The patient is on long-term Coumadin because of her past history of pulmonary embolism.  She is not having any side effects from the Coumadin such as easy bruising or bleeding.  By and large her Coumadin levels have been stable from month to month.

## 2012-03-29 NOTE — Progress Notes (Signed)
Cathy Leonard Date of Birth:  1940/09/10 [facility] [address] [place], [place]  [address] [phone]  Fax   [phone]  HPI: This pleasant 71 year old woman is seen for a scheduled 6 month followup office visit. She has a history of having had a pulmonary embolus in 2009. She does not have any history of ischemic heart disease. She had a normal Myoview treadmill stress test in 2008. She has been on long-term Coumadin since her pulmonary embolism. She does not have any history of known atrial fibrillation. Recently she has been experiencing increased palpitations.  She wore a heart monitor for 30 days in March and April 2013.  It showed normal sinus rhythm with occasional interpolated PVCs and no significant or dangerous arrhythmias.  She does try to limit her caffeine intake.. She has a history of hypercholesterolemia.  This is being monitored by her primary care physician.   Current Outpatient Prescriptions  Medication Sig Dispense Refill  . acetaminophen (TYLENOL ARTHRITIS PAIN) 650 MG CR tablet Take 650 mg by mouth as needed.      Marland Kitchen alendronate (FOSAMAX) 70 MG tablet Take 70 mg by mouth every 7 (seven) days. Take in the morning with a full glass of water, on an empty stomach, and do not take anything else by mouth or lie down for the next 30 min.       Marland Kitchen ALPRAZolam (XANAX) 0.5 MG tablet Take 1 tablet (0.5 mg total) by mouth every 6 (six) hours as needed.  100 tablet  1  . Biotin 2500 MCG CAPS Take 5,000 mcg by mouth daily.       . Calcium Carbonate-Vitamin D (CALCIUM + D PO) Take 600 mg by mouth daily.       . Cholecalciferol (VITAMIN D PO) Take 2,000 Units by mouth daily.        . digoxin (LANOXIN) 0.25 MG tablet TAKE ONE-HALF (1/2) TABLET  DAILY  45 tablet  3  . etanercept (ENBREL) 50 MG/ML injection Inject 50 mg into the skin once a week.       . folic acid (FOLVITE) 400 MCG tablet Take 400 mcg by mouth 2 (two) times daily.      . Multiple Vitamins-Minerals  (EYE-VITES) TABS Take by mouth daily.        . propranolol (INDERAL) 20 MG tablet Take 1 tablet (20 mg total) by mouth 2 (two) times daily.  180 tablet  3  . warfarin (COUMADIN) 5 MG tablet Take 5 mg by mouth daily. 5 mg 6 days a week and 1/2 tablet on thursday        No Known Allergies  Patient Active Problem List  Diagnosis  . ANXIETY STATE, UNSPECIFIED  . MITRAL VALVE PROLAPSE  . ROSACEA  . GENERALIZED OSTEOARTHROSIS INVOLVING HAND  . BACK PAIN, ACUTE  . HAND PAIN  . PERSONAL HISTORY OF MALIGNANT NEOPLASM OF BREAST  . Pulmonary embolism  . Long term current use of anticoagulant  . Palpitations  . Rheumatoid arthritis  . Vitiligo  . Abdominal pain    History  Smoking status  . Never Smoker   Smokeless tobacco  . Not on file    History  Alcohol Use: Not on file    No family history on file.  Review of Systems: The patient denies any heat or cold intolerance.  No weight gain or weight loss.  The patient denies headaches or blurry vision.  There is no cough or sputum production.  The patient  denies dizziness.  There is no hematuria or hematochezia.  The patient denies any muscle aches or arthritis.  The patient denies any rash.  The patient denies frequent falling or instability.  There is no history of depression or anxiety.  All other systems were reviewed and are negative.   Physical Exam: Filed Vitals:   03/29/12 1601  BP: 118/70  Pulse: 80   the general appearance reveals a well-developed well-nourished woman in no distress.Pupils equal and reactive.   Extraocular Movements are full.  There is no scleral icterus.  The mouth and pharynx are normal.  The neck is supple.  The carotids reveal no bruits.  The jugular venous pressure is normal.  The thyroid is not enlarged.  There is no lymphadenopathy.  The chest is clear to percussion and auscultation. There are no rales or rhonchi. Expansion of the chest is symmetrical.  The precordium is quiet.  The first heart  sound is normal.  The second heart sound is physiologically split.  There is no murmur gallop rub or click.  There is no abnormal lift or heave.  Rhythm is regular The abdomen is soft and nontender. Bowel sounds are normal. The liver and spleen are not enlarged. There Are no abdominal masses. There are no bruits.  The pedal pulses are good.  There is no phlebitis or edema.  There is no cyanosis or clubbing.  She does have deformities consistent with rheumatoid arthritis in her hands. Strength is normal and symmetrical in all extremities.  There is no lateralizing weakness.  There are no sensory deficits.       Assessment / Plan: Continue same medication.  Recheck in 6 months for followup office visit and EKG

## 2012-03-29 NOTE — Patient Instructions (Addendum)
Your physician recommends that you continue on your current medications as directed. Please refer to the Current Medication list given to you today.  Your physician wants you to follow-up in: 6 month. You will receive a reminder letter in the mail two months in advance. If you don't receive a letter, please call our office to schedule the follow-up appointment.  

## 2012-03-29 NOTE — Assessment & Plan Note (Signed)
The patient continues to experience occasional palpitations.  She feels a fluttering in her chest.  She has not confirmed whether this is definitely from her heart.  We talked about how she should check her radial pulse and see if it corresponds to what she is feeling in her chest.  As noted her recent 30 day event monitor was essentially unrevealing except for sporadic PVCs.

## 2012-03-29 NOTE — Assessment & Plan Note (Signed)
The patient is on Enbrel for her rheumatoid arthritis.  She has not been having any worsening recently.  She occasionally has to take a burst of steroids if her arthritis flares up.  She is trying to get regular exercise.  She goes to [employer] 3 days a week she also tries to walk on her own when the weather permits

## 2012-03-29 NOTE — Assessment & Plan Note (Signed)
She has not had any recent pleuritic pain or chest pain to suggest recurrent pulmonary embolus.

## 2012-05-02 ENCOUNTER — Other Ambulatory Visit: Payer: Self-pay | Admitting: *Deleted

## 2012-05-02 ENCOUNTER — Other Ambulatory Visit: Payer: Self-pay

## 2012-05-02 MED ORDER — DIGOXIN 250 MCG PO TABS: 0.1250 mg | ORAL_TABLET | Freq: Every day | ORAL | Status: DC

## 2012-07-19 ENCOUNTER — Other Ambulatory Visit: Payer: Self-pay

## 2012-07-19 DIAGNOSIS — F419 Anxiety disorder, unspecified: Secondary | ICD-10-CM

## 2012-07-21 MED ORDER — ALPRAZOLAM 0.5 MG PO TABS: 0.5000 mg | ORAL_TABLET | Freq: Four times a day (QID) | ORAL | Status: DC | PRN

## 2012-07-22 ENCOUNTER — Other Ambulatory Visit: Payer: Self-pay | Admitting: *Deleted

## 2012-07-22 DIAGNOSIS — F419 Anxiety disorder, unspecified: Secondary | ICD-10-CM

## 2012-07-22 MED ORDER — ALPRAZOLAM 0.5 MG PO TABS: 0.5000 mg | ORAL_TABLET | Freq: Four times a day (QID) | ORAL | Status: DC | PRN

## 2012-07-23 ENCOUNTER — Other Ambulatory Visit: Payer: Self-pay

## 2012-07-23 DIAGNOSIS — F419 Anxiety disorder, unspecified: Secondary | ICD-10-CM

## 2012-08-05 ENCOUNTER — Telehealth: Payer: Self-pay | Admitting: Cardiology

## 2012-08-05 MED ORDER — DIGOXIN 125 MCG PO TABS: 0.1250 mg | ORAL_TABLET | Freq: Every day | ORAL | Status: DC

## 2012-08-05 NOTE — Telephone Encounter (Signed)
Rx sent as requested.

## 2012-08-05 NOTE — Telephone Encounter (Signed)
Pt's digoxin 2.5mg  not covered, 0.125mg  is covered called this be changed and called into [facility] for 90 days supply, pt low and needs asap,  pls call [phone]

## 2012-08-26 ENCOUNTER — Encounter: Payer: Self-pay | Admitting: Cardiology

## 2012-09-25 ENCOUNTER — Other Ambulatory Visit: Payer: Self-pay

## 2012-09-26 ENCOUNTER — Other Ambulatory Visit: Payer: Self-pay | Admitting: Family Medicine

## 2012-10-01 ENCOUNTER — Other Ambulatory Visit: Payer: Self-pay | Admitting: Family Medicine

## 2012-10-01 DIAGNOSIS — M8580 Other specified disorders of bone density and structure, unspecified site: Secondary | ICD-10-CM

## 2012-10-22 ENCOUNTER — Other Ambulatory Visit: Payer: Medicare Other

## 2012-10-22 ENCOUNTER — Ambulatory Visit: Payer: Medicare Other

## 2012-11-04 ENCOUNTER — Other Ambulatory Visit: Payer: Self-pay | Admitting: *Deleted

## 2012-11-04 ENCOUNTER — Other Ambulatory Visit: Payer: Self-pay | Admitting: Family Medicine

## 2012-11-04 DIAGNOSIS — R1032 Left lower quadrant pain: Secondary | ICD-10-CM

## 2012-11-04 MED ORDER — PROPRANOLOL HCL 20 MG PO TABS: 20.0000 mg | ORAL_TABLET | Freq: Two times a day (BID) | ORAL | Status: DC

## 2012-11-05 ENCOUNTER — Ambulatory Visit
Admission: RE | Admit: 2012-11-05 | Discharge: 2012-11-05 | Disposition: A | Discharge status: Home / Self Care | Payer: Medicare Other | Source: Ambulatory Visit | Attending: Family Medicine | Admitting: Family Medicine

## 2012-11-05 DIAGNOSIS — R1032 Left lower quadrant pain: Secondary | ICD-10-CM

## 2012-11-05 MED ORDER — IOHEXOL 300 MG/ML  SOLN
100.0000 mL | Freq: Once | INTRAMUSCULAR | Status: AC | PRN
  Administered 2012-11-05: 100 mL via INTRAVENOUS

## 2012-11-13 ENCOUNTER — Other Ambulatory Visit (HOSPITAL_COMMUNITY): Payer: Self-pay | Admitting: Family Medicine

## 2012-11-13 DIAGNOSIS — S32000A Wedge compression fracture of unspecified lumbar vertebra, initial encounter for closed fracture: Secondary | ICD-10-CM

## 2012-11-15 ENCOUNTER — Other Ambulatory Visit: Payer: Self-pay | Admitting: Family Medicine

## 2012-11-15 DIAGNOSIS — S32000A Wedge compression fracture of unspecified lumbar vertebra, initial encounter for closed fracture: Secondary | ICD-10-CM

## 2012-11-18 ENCOUNTER — Ambulatory Visit
Admission: RE | Admit: 2012-11-18 | Discharge: 2012-11-18 | Disposition: A | Discharge status: Home / Self Care | Payer: Medicare Other | Source: Ambulatory Visit | Attending: Family Medicine | Admitting: Family Medicine

## 2012-11-18 ENCOUNTER — Ambulatory Visit
Admission: RE | Admit: 2012-11-18 | Discharge: 2012-11-18 | Disposition: A | Discharge status: Home / Self Care | Payer: Medicare Other | Source: Ambulatory Visit

## 2012-11-18 DIAGNOSIS — M8580 Other specified disorders of bone density and structure, unspecified site: Secondary | ICD-10-CM

## 2012-11-18 DIAGNOSIS — Z1231 Encounter for screening mammogram for malignant neoplasm of breast: Secondary | ICD-10-CM

## 2012-11-20 ENCOUNTER — Ambulatory Visit (HOSPITAL_COMMUNITY): Payer: Medicare Other

## 2012-11-24 ENCOUNTER — Ambulatory Visit
Admission: RE | Admit: 2012-11-24 | Discharge: 2012-11-24 | Disposition: A | Discharge status: Home / Self Care | Payer: Medicare Other | Source: Ambulatory Visit | Attending: Family Medicine | Admitting: Family Medicine

## 2012-11-24 DIAGNOSIS — S32000A Wedge compression fracture of unspecified lumbar vertebra, initial encounter for closed fracture: Secondary | ICD-10-CM

## 2012-12-05 ENCOUNTER — Other Ambulatory Visit (HOSPITAL_COMMUNITY): Payer: Self-pay | Admitting: Interventional Radiology

## 2012-12-05 ENCOUNTER — Telehealth (HOSPITAL_COMMUNITY): Payer: Self-pay | Admitting: Interventional Radiology

## 2012-12-05 DIAGNOSIS — IMO0002 Reserved for concepts with insufficient information to code with codable children: Secondary | ICD-10-CM

## 2012-12-11 ENCOUNTER — Ambulatory Visit (HOSPITAL_COMMUNITY)
Admission: RE | Admit: 2012-12-11 | Discharge: 2012-12-11 | Disposition: A | Discharge status: Home / Self Care | Payer: Medicare Other | Source: Ambulatory Visit | Attending: Interventional Radiology | Admitting: Interventional Radiology

## 2012-12-11 DIAGNOSIS — IMO0002 Reserved for concepts with insufficient information to code with codable children: Secondary | ICD-10-CM

## 2013-01-23 ENCOUNTER — Ambulatory Visit (INDEPENDENT_AMBULATORY_CARE_PROVIDER_SITE_OTHER): Payer: Medicare Other | Admitting: Cardiology

## 2013-01-23 ENCOUNTER — Encounter: Payer: Self-pay | Admitting: Cardiology

## 2013-01-23 VITALS — BP 110/64 | HR 73 | Ht 64.0 in | Wt 129.8 lb

## 2013-01-23 DIAGNOSIS — I059 Rheumatic mitral valve disease, unspecified: Secondary | ICD-10-CM

## 2013-01-23 DIAGNOSIS — M549 Dorsalgia, unspecified: Secondary | ICD-10-CM

## 2013-01-23 DIAGNOSIS — R002 Palpitations: Secondary | ICD-10-CM

## 2013-01-23 NOTE — Progress Notes (Signed)
Cathy Leonard Date of Birth:  November 09, 1940 [facility] [address] [place], [place]  [address] [phone]  Fax   [phone]  HPI: This pleasant 72 year old woman is seen for a scheduled 6 month followup office visit. She has a history of having had a pulmonary embolus in 2009. She does not have any history of ischemic heart disease. She had a normal Myoview treadmill stress test in 2008. She has been on long-term Coumadin since her pulmonary embolism. She does not have any history of known atrial fibrillation. Recently she has been experiencing increased palpitations. She wore a heart monitor for 30 days in March and April 2013. It showed normal sinus rhythm with occasional interpolated PVCs and no significant or dangerous arrhythmias. She does try to limit her caffeine intake.. She has a history of hypercholesterolemia. This is being monitored by her primary care physician. She's had no new cardiac complaints.  She did develop a spontaneous compression fracture of a lumbar vertebra.  Consideration was given to vertebroplasty but she improved spontaneously very quickly and did not require any therapy.  She was on Fosamax which she continues to take. Current Outpatient Prescriptions  Medication Sig Dispense Refill  . acetaminophen (TYLENOL ARTHRITIS PAIN) 650 MG CR tablet Take 650 mg by mouth as needed.      Marland Kitchen alendronate (FOSAMAX) 70 MG tablet Take 70 mg by mouth every 7 (seven) days. Take in the morning with a full glass of water, on an empty stomach, and do not take anything else by mouth or lie down for the next 30 min.       Marland Kitchen ALPRAZolam (XANAX) 0.5 MG tablet Take 1 tablet (0.5 mg total) by mouth every 6 (six) hours as needed.  100 tablet  2  . Biotin 2500 MCG CAPS Take 5,000 mcg by mouth daily.       . Calcium Carbonate-Vitamin D (CALCIUM + D PO) Take 600 mg by mouth daily.       . Cholecalciferol (VITAMIN D PO) Take 2,000 Units by mouth daily.        . digoxin  (LANOXIN) 0.125 MG tablet Take 1 tablet (0.125 mg total) by mouth daily.  90 tablet  3  . etanercept (ENBREL) 50 MG/ML injection Inject 50 mg into the skin once a week.       . folic acid (FOLVITE) 400 MCG tablet Take 400 mcg by mouth 2 (two) times daily.      . Multiple Vitamins-Minerals (EYE-VITES) TABS Take by mouth daily.        . propranolol (INDERAL) 20 MG tablet Take 1 tablet (20 mg total) by mouth 2 (two) times daily.  180 tablet  3  . warfarin (COUMADIN) 5 MG tablet Take 5 mg by mouth daily. 5 mg 6 days a week and 1/2 tablet on thursday       No current facility-administered medications for this visit.    No Known Allergies  Patient Active Problem List   Diagnosis Date Noted  . Long term current use of anticoagulant 10/31/2010    Priority: High  . Palpitations 12/06/2010    Priority: Medium  . Rheumatoid arthritis 12/06/2010    Priority: Medium  . Abdominal pain 03/07/2011  . Vitiligo 12/06/2010  . Pulmonary embolism 10/31/2010  . GENERALIZED OSTEOARTHROSIS INVOLVING HAND 12/10/2007  . BACK PAIN, ACUTE 11/08/2007  . HAND PAIN 11/08/2007  . ANXIETY STATE, UNSPECIFIED 10/10/2007  . PERSONAL HISTORY OF MALIGNANT NEOPLASM OF BREAST 10/10/2007  . MITRAL  VALVE PROLAPSE 09/20/2006  . ROSACEA 09/20/2006    History  Smoking status  . Never Smoker   Smokeless tobacco  . Not on file    History  Alcohol Use: Not on file    No family history on file.  Review of Systems: The patient denies any heat or cold intolerance.  No weight gain or weight loss.  The patient denies headaches or blurry vision.  There is no cough or sputum production.  The patient denies dizziness.  There is no hematuria or hematochezia.  The patient denies any muscle aches or arthritis.  The patient denies any rash.  The patient denies frequent falling or instability.  There is no history of depression or anxiety.  All other systems were reviewed and are negative.   Physical Exam: Filed Vitals:    01/23/13 1406  BP: 110/64  Pulse: 73   the general appearance reveals a well-developed well-nourished middle-aged woman in no distress.The head and neck exam reveals pupils equal and reactive.  Extraocular movements are full.  There is no scleral icterus.  The mouth and pharynx are normal.  The neck is supple.  The carotids reveal no bruits.  The jugular venous pressure is normal.  The  thyroid is not enlarged.  There is no lymphadenopathy.  The chest is clear to percussion and auscultation.  There are no rales or rhonchi.  Expansion of the chest is symmetrical.  The precordium is quiet.  The first heart sound is normal.  The second heart sound is physiologically split.  There is no murmur gallop rub or click.  There is no abnormal lift or heave.  The abdomen is soft and nontender.  The bowel sounds are normal.  The liver and spleen are not enlarged.  There are no abdominal masses.  There are no abdominal bruits.  Extremities reveal good pedal pulses.  There is no phlebitis or edema.  There is no cyanosis or clubbing.  Strength is normal and symmetrical in all extremities.  There is no lateralizing weakness.  There are no sensory deficits.  The skin is warm and dry.  There is no rash.  EKG shows normal sinus rhythm with nonspecific ST abnormality.  Assessment / Plan: Overall the patient is doing well.  She remains on long-term Coumadin because of her unprovoked pulmonary embolus and history of suspected hypercoagulability. Continue same medication and be rechecked in 6 months.

## 2013-01-23 NOTE — Assessment & Plan Note (Signed)
The patient has not had any chest pain she has occasional palpitations.  She is not having increased dyspnea and she is not having any symptoms of phlebitis or DVT or pedal edema

## 2013-01-23 NOTE — Patient Instructions (Addendum)
Your physician recommends that you continue on your current medications as directed. Please refer to the Current Medication list given to you today.  Your physician wants you to follow-up in: 6 MONTH OV  You will receive a reminder letter in the mail two months in advance. If you don't receive a letter, please call our office to schedule the follow-up appointment.  

## 2013-02-26 ENCOUNTER — Other Ambulatory Visit: Payer: Self-pay

## 2013-03-03 ENCOUNTER — Other Ambulatory Visit: Payer: Self-pay | Admitting: Cardiology

## 2013-03-03 DIAGNOSIS — F419 Anxiety disorder, unspecified: Secondary | ICD-10-CM

## 2013-03-05 ENCOUNTER — Encounter: Payer: Self-pay | Admitting: Cardiology

## 2013-03-06 ENCOUNTER — Other Ambulatory Visit: Payer: Self-pay | Admitting: *Deleted

## 2013-05-28 ENCOUNTER — Telehealth: Payer: Self-pay | Admitting: *Deleted

## 2013-05-28 DIAGNOSIS — Z853 Personal history of malignant neoplasm of breast: Secondary | ICD-10-CM

## 2013-05-28 NOTE — Telephone Encounter (Signed)
Message from pt reporting her daughter's physician has recommended she find out her genetic risk for cancer. Pt is requesting referral to [facility] at [facility]. Reviewed with Dr. Truett Perna. Referral sent.

## 2013-05-29 ENCOUNTER — Other Ambulatory Visit: Payer: Self-pay

## 2013-05-29 ENCOUNTER — Telehealth: Payer: Self-pay | Admitting: Oncology

## 2013-05-29 NOTE — Telephone Encounter (Signed)
Talked patient and she is aware of appt on 11/10 Genetic consult

## 2013-06-02 ENCOUNTER — Other Ambulatory Visit: Payer: Commercial Managed Care - HMO | Admitting: Lab

## 2013-06-02 ENCOUNTER — Ambulatory Visit (HOSPITAL_BASED_OUTPATIENT_CLINIC_OR_DEPARTMENT_OTHER): Payer: Medicare Other | Admitting: Genetic Counselor

## 2013-06-02 ENCOUNTER — Encounter: Payer: Self-pay | Admitting: Genetic Counselor

## 2013-06-02 DIAGNOSIS — Z803 Family history of malignant neoplasm of breast: Secondary | ICD-10-CM

## 2013-06-02 DIAGNOSIS — Z853 Personal history of malignant neoplasm of breast: Secondary | ICD-10-CM

## 2013-06-02 DIAGNOSIS — IMO0002 Reserved for concepts with insufficient information to code with codable children: Secondary | ICD-10-CM

## 2013-06-02 NOTE — Progress Notes (Signed)
Dr.  Mancel Bale requested a consultation for genetic counseling and risk assessment for Cathy Leonard, a 72 y.o. female, for discussion of her personal and family history of breast cancer.  She presents to clinic today to discuss the possibility of a genetic predisposition to cancer, and to further clarify her risks, as well as her family members' risks for cancer.   HISTORY OF PRESENT ILLNESS: In 2004, at the age of 26, Cathy Leonard was diagnosed with invasive ductal carcinoma of the breast. This was treated with lumpectomy, chemotherapy, radiation and aromadex for 5 years.  The tumor was ER+/PR+/Her2-.    Past Medical History  Diagnosis Date  . Rheumatoid arthritis(714.0)   . Pulmonary embolism 2009  . Breast cancer 2004  . Osteoporosis   . Lung nodules     Benign  . Thrombocytopenia   . Macular degeneration   . Mitral valve prolapse   . Palpitations   . Diverticulitis     Past Surgical History  Procedure Laterality Date  . Breast lumpectomy  2003    Left, Chemotherapy/Radiation  . Nuclear sress test  2008    EF 67%, Normal  . US echocardiography  2010    EF 55-60%, Mild Aortic Sclerosis, MVP, mild MR    History   Social History  . Marital Status: Single    Spouse Name: N/A    Number of Children: 2  . Years of Education: N/A   Occupational History  .     Social History Main Topics  . Smoking status: Never Smoker   . Smokeless tobacco: Not on file  . Alcohol Use: Not on file  . Drug Use: Not on file  . Sexual Activity: Not on file   Other Topics Concern  . Not on file   Social History Narrative  . No narrative on file    REPRODUCTIVE HISTORY AND PERSONAL RISK ASSESSMENT FACTORS: Menarche was at age 33.   postmenopausal Uterus Intact: yes Ovaries Intact: yes G2P2A0, first live birth at age 52  She has not previously undergone treatment for infertility.   Oral Contraceptive use: 10 years   She has not used HRT in the past.    FAMILY HISTORY:   We obtained a detailed, 4-generation family history.  Significant diagnoses are listed below: Family History  Problem Relation Age of Onset  . Breast cancer Mother 4  . Breast cancer Sister 71  The patient has no family history information on her father's side of the family.  Patient's maternal ancestors are of [place] descent, and paternal ancestors are of unknown descent. There is no reported Ashkenazi Jewish ancestry. There is no known consanguinity.  GENETIC COUNSELING ASSESSMENT: Cathy Leonard is a 72 y.o. female with a personal history of breast cancer and fmaily history of breast cancer which somewhat suggestive of a hereditary cancer syndrome and predisposition to cancer. We, therefore, discussed and recommended the following at today's visit.   DISCUSSION: We reviewed the characteristics, features and inheritance patterns of hereditary cancer syndromes. We also discussed genetic testing, including the appropriate family members to test, the process of testing, insurance coverage and turn-around-time for results. The breast cancer on the pateints side of the family is more of average age of onset or later age of onset.  Therefore, if the patient is negative for any hereditary mutations this would most likely fall into a familial breast cancer category.  We discussed that if she is negative then her children do not need  to worry about a hereditary cancer syndrome coming from her side of the family.  However, her daughter's father was Ashkenazi Jewish.  Therefore, based on that heritage, he daughter may wish to pursue genetic testing for the common mutations within BRCA that are found in the Ashkenazi Jewish population.  PLAN: After considering the risks, benefits, and limitations, Cathy Leonard provided informed consent to pursue genetic testing and the blood sample will be sent to [facility] for analysis of the Breast/Ovarian cancer panel. We discussed the implications of a  positive, negative and/ or variant of uncertain significance genetic test result. Results should be available within approximately 3 weeks' time, at which point they will be disclosed by telephone to Cathy Leonard, as will any additional recommendations warranted by these results. Cathy Leonard will receive a summary of her genetic counseling visit and a copy of her results once available. This information will also be available in Epic. We encouraged Cathy Leonard to remain in contact with cancer genetics annually so that we can continuously update the family history and inform her of any changes in cancer genetics and testing that may be of benefit for her family. Cathy Leonard's questions were answered to her satisfaction today. Our contact information was provided should additional questions or concerns arise.  The patient was seen for a total of 60 minutes, greater than 50% of which was spent face-to-face counseling.  This note will also be sent to the referring provider via the electronic medical record. The patient will be supplied with a summary of this genetic counseling discussion as well as educational information on the discussed hereditary cancer syndromes following the conclusion of their visit.   Patient was discussed with Dr. Drue Leonard.   _______________________________________________________________________ For Office Staff:  Number of people involved in session: 1 Was an Intern/ student involved with case: no

## 2013-06-16 ENCOUNTER — Telehealth: Payer: Self-pay | Admitting: Genetic Counselor

## 2013-06-16 NOTE — Telephone Encounter (Signed)
Revealed negative genetic test results with an NBN VUS.

## 2013-06-17 ENCOUNTER — Encounter: Payer: Self-pay | Admitting: Genetic Counselor

## 2013-08-07 ENCOUNTER — Other Ambulatory Visit: Payer: Self-pay

## 2013-08-07 MED ORDER — DIGOXIN 125 MCG PO TABS: 0.1250 mg | ORAL_TABLET | Freq: Every day | ORAL | Status: DC

## 2013-08-14 ENCOUNTER — Ambulatory Visit: Payer: Self-pay | Admitting: Cardiology

## 2013-08-14 DIAGNOSIS — Z7901 Long term (current) use of anticoagulants: Secondary | ICD-10-CM

## 2013-08-14 DIAGNOSIS — I2699 Other pulmonary embolism without acute cor pulmonale: Secondary | ICD-10-CM

## 2013-09-12 ENCOUNTER — Telehealth: Payer: Self-pay | Admitting: Cardiology

## 2013-09-12 NOTE — Telephone Encounter (Signed)
ERROR

## 2013-09-15 ENCOUNTER — Ambulatory Visit (INDEPENDENT_AMBULATORY_CARE_PROVIDER_SITE_OTHER): Payer: Medicare HMO | Admitting: Cardiology

## 2013-09-15 ENCOUNTER — Encounter: Payer: Self-pay | Admitting: Cardiology

## 2013-09-15 VITALS — BP 126/68 | HR 71 | Ht 64.0 in | Wt 128.0 lb

## 2013-09-15 DIAGNOSIS — I2699 Other pulmonary embolism without acute cor pulmonale: Secondary | ICD-10-CM

## 2013-09-15 DIAGNOSIS — M069 Rheumatoid arthritis, unspecified: Secondary | ICD-10-CM

## 2013-09-15 DIAGNOSIS — R002 Palpitations: Secondary | ICD-10-CM

## 2013-09-15 DIAGNOSIS — Z7901 Long term (current) use of anticoagulants: Secondary | ICD-10-CM

## 2013-09-15 DIAGNOSIS — I059 Rheumatic mitral valve disease, unspecified: Secondary | ICD-10-CM

## 2013-09-15 NOTE — Assessment & Plan Note (Signed)
The patient has not been aware of any sustained palpitations or tachycardia.  She notes occasional isolated premature beats.  These may have increased recently while she was taking supplemental prednisone for her rheumatoid arthritis

## 2013-09-15 NOTE — Assessment & Plan Note (Signed)
The patient remains on long-term Enbrel once a month injection for her rheumatoid arthritis which generally has worked well for her.

## 2013-09-15 NOTE — Progress Notes (Signed)
Cathy Leonard Date of Birth:  [birth year] [address] [place], [place]  [address] [phone]  Fax   [phone]  HPI: This pleasant 73 year old woman is seen for a scheduled 6 month followup office visit. She has a history of having had a pulmonary embolus in 2009. She does not have any history of ischemic heart disease. She had a normal Myoview treadmill stress test in 2008. She has been on long-term Coumadin since her pulmonary embolism.  She has a history of hypercoagulability. She does not have any history of known atrial fibrillation. Recently she has been experiencing increased palpitations. She wore a heart monitor for 30 days in March and April 2013. It showed normal sinus rhythm with occasional interpolated PVCs and no significant or dangerous arrhythmias. She does try to limit her caffeine intake.. She has a history of hypercholesterolemia. This is being monitored by her primary care physician. She's had no new cardiac complaints.  She did develop a spontaneous compression fracture of a lumbar vertebra.  Consideration was given to vertebroplasty but she improved spontaneously very quickly and did not require any therapy.  She was on Fosamax which she continues to take. Current Outpatient Prescriptions  Medication Sig Dispense Refill  . acetaminophen (TYLENOL ARTHRITIS PAIN) 650 MG CR tablet Take 650 mg by mouth as needed.      Marland Kitchen alendronate (FOSAMAX) 70 MG tablet Take 70 mg by mouth every 7 (seven) days. Take in the morning with a full glass of water, on an empty stomach, and do not take anything else by mouth or lie down for the next 30 min.       Marland Kitchen ALPRAZolam (XANAX) 0.5 MG tablet TAKE 1 TABLET EVERY 6 HOURS AS NEEDED  100 tablet  2  . Biotin 2500 MCG CAPS Take 5,000 mcg by mouth daily.       . Calcium Carbonate-Vitamin D (CALCIUM + D PO) Take 600 mg by mouth daily.       . Cholecalciferol (VITAMIN D PO) Take 2,000 Units by mouth daily.        . digoxin (LANOXIN)  0.125 MG tablet Take 1 tablet (0.125 mg total) by mouth daily.  90 tablet  3  . etanercept (ENBREL) 50 MG/ML injection Inject 50 mg into the skin once a week.       . folic acid (FOLVITE) 696 MCG tablet Take 400 mcg by mouth 2 (two) times daily.      . Multiple Vitamins-Minerals (EYE-VITES) TABS Take by mouth daily.        . propranolol (INDERAL) 20 MG tablet Take 1 tablet (20 mg total) by mouth 2 (two) times daily.  180 tablet  3  . warfarin (COUMADIN) 5 MG tablet Take 5 mg by mouth daily. 5 mg 6 days a week and 1/2 tablet on thursday       No current facility-administered medications for this visit.    No Known Allergies  Patient Active Problem List   Diagnosis Date Noted  . Long term current use of anticoagulant 10/31/2010    Priority: High  . Palpitations 12/06/2010    Priority: Medium  . Rheumatoid arthritis 12/06/2010    Priority: Medium  . Abdominal pain 03/07/2011  . Vitiligo 12/06/2010  . Pulmonary embolism 10/31/2010  . GENERALIZED OSTEOARTHROSIS INVOLVING HAND 12/10/2007  . BACK PAIN, ACUTE 11/08/2007  . HAND PAIN 11/08/2007  . ANXIETY STATE, UNSPECIFIED 10/10/2007  . PERSONAL HISTORY OF MALIGNANT NEOPLASM OF BREAST 10/10/2007  .  MITRAL VALVE PROLAPSE 09/20/2006  . ROSACEA 09/20/2006    History  Smoking status  . Never Smoker   Smokeless tobacco  . Not on file    History  Alcohol Use: Not on file    Family History  Problem Relation Age of Onset  . Breast cancer Mother 55  . Breast cancer Sister 20    Review of Systems: The patient denies any heat or cold intolerance.  No weight gain or weight loss.  The patient denies headaches or blurry vision.  There is no cough or sputum production.  The patient denies dizziness.  There is no hematuria or hematochezia.  The patient denies any muscle aches or arthritis.  The patient denies any rash.  The patient denies frequent falling or instability.  There is no history of depression or anxiety.  All other systems were  reviewed and are negative.   Physical Exam: Filed Vitals:   09/15/13 1356  BP: 126/68  Pulse: 71   the general appearance reveals a well-developed well-nourished middle-aged woman in no distress.The head and neck exam reveals pupils equal and reactive.  Extraocular movements are full.  There is no scleral icterus.  The mouth and pharynx are normal.  The neck is supple.  The carotids reveal no bruits.  The jugular venous pressure is normal.  The  thyroid is not enlarged.  There is no lymphadenopathy.  The chest is clear to percussion and auscultation.  There are no rales or rhonchi.  Expansion of the chest is symmetrical.  The precordium is quiet.  The first heart sound is normal.  The second heart sound is physiologically split.  There is no murmur gallop rub or click.  There is no abnormal lift or heave.  The abdomen is soft and nontender.  The bowel sounds are normal.  The liver and spleen are not enlarged.  There are no abdominal masses.  There are no abdominal bruits.  Extremities reveal good pedal pulses.  There is no phlebitis or edema.  There is no cyanosis or clubbing.  Strength is normal and symmetrical in all extremities.  There is no lateralizing weakness.  There are no sensory deficits.  The skin is warm and dry.  There is no rash.    Assessment / Plan: Overall the patient is doing well.  She remains on long-term Coumadin because of her unprovoked pulmonary embolus and history of suspected hypercoagulability. Continue same medication and be rechecked in 6 months for office visit and EKG

## 2013-09-15 NOTE — Patient Instructions (Signed)
Your physician recommends that you continue on your current medications as directed. Please refer to the Current Medication list given to you today.  Your physician wants you to follow-up in: 6 month ov/ekg You will receive a reminder letter in the mail two months in advance. If you don't receive a letter, please call our office to schedule the follow-up appointment.  

## 2013-09-15 NOTE — Assessment & Plan Note (Signed)
The patient remains on long-term Coumadin anticoagulation.  She has not had any pleuritic chest pain or evidence of recurrent pulmonary emboli.

## 2013-10-15 ENCOUNTER — Other Ambulatory Visit: Payer: Self-pay | Admitting: *Deleted

## 2013-10-15 MED ORDER — PROPRANOLOL HCL 20 MG PO TABS: 20.0000 mg | ORAL_TABLET | Freq: Two times a day (BID) | ORAL | Status: DC

## 2013-12-09 ENCOUNTER — Other Ambulatory Visit: Payer: Self-pay

## 2013-12-09 DIAGNOSIS — Z1231 Encounter for screening mammogram for malignant neoplasm of breast: Secondary | ICD-10-CM

## 2013-12-25 ENCOUNTER — Ambulatory Visit
Admission: RE | Admit: 2013-12-25 | Discharge: 2013-12-25 | Disposition: A | Discharge status: Home / Self Care | Payer: Commercial Managed Care - HMO | Source: Ambulatory Visit

## 2013-12-25 DIAGNOSIS — Z1231 Encounter for screening mammogram for malignant neoplasm of breast: Secondary | ICD-10-CM

## 2014-01-19 ENCOUNTER — Telehealth: Payer: Self-pay | Admitting: Cardiology

## 2014-01-19 DIAGNOSIS — Z79899 Other long term (current) drug therapy: Secondary | ICD-10-CM

## 2014-01-19 NOTE — Telephone Encounter (Signed)
The forteo may increase the digoxin level slightly.  Okay to start forteo and come in for a digoxin level about a week after starting the forteo.

## 2014-01-19 NOTE — Telephone Encounter (Signed)
New message     Patient calling PCP started  on forteo injection to replace previous medication . Patient on digoxin. Please advise.

## 2014-01-19 NOTE — Telephone Encounter (Signed)
Will forward to  Dr. Brackbill for review 

## 2014-01-19 NOTE — Telephone Encounter (Signed)
Advised and scheduled labs

## 2014-02-02 ENCOUNTER — Telehealth: Payer: Self-pay | Admitting: Cardiology

## 2014-02-02 NOTE — Telephone Encounter (Signed)
New problem follow up:     Pt called to cancel lab appt   7/16  Done.

## 2014-02-03 NOTE — Telephone Encounter (Signed)
Noted  

## 2014-02-05 ENCOUNTER — Other Ambulatory Visit: Payer: Commercial Managed Care - HMO

## 2014-02-10 ENCOUNTER — Encounter (HOSPITAL_COMMUNITY): Payer: Self-pay | Admitting: Emergency Medicine

## 2014-02-10 ENCOUNTER — Emergency Department (HOSPITAL_COMMUNITY): Payer: Medicare HMO

## 2014-02-10 ENCOUNTER — Inpatient Hospital Stay (HOSPITAL_COMMUNITY)
Admission: EM | Admit: 2014-02-10 | Discharge: 2014-02-13 | DRG: 193 | Disposition: A | Discharge status: Home / Self Care | Payer: Medicare HMO | Attending: Internal Medicine | Admitting: Internal Medicine

## 2014-02-10 DIAGNOSIS — H3530 Unspecified macular degeneration: Secondary | ICD-10-CM | POA: Diagnosis present

## 2014-02-10 DIAGNOSIS — R0602 Shortness of breath: Secondary | ICD-10-CM | POA: Diagnosis not present

## 2014-02-10 DIAGNOSIS — Z803 Family history of malignant neoplasm of breast: Secondary | ICD-10-CM | POA: Diagnosis not present

## 2014-02-10 DIAGNOSIS — IMO0002 Reserved for concepts with insufficient information to code with codable children: Secondary | ICD-10-CM

## 2014-02-10 DIAGNOSIS — J8409 Other alveolar and parieto-alveolar conditions: Secondary | ICD-10-CM | POA: Diagnosis present

## 2014-02-10 DIAGNOSIS — J9600 Acute respiratory failure, unspecified whether with hypoxia or hypercapnia: Secondary | ICD-10-CM | POA: Diagnosis present

## 2014-02-10 DIAGNOSIS — M810 Age-related osteoporosis without current pathological fracture: Secondary | ICD-10-CM | POA: Diagnosis present

## 2014-02-10 DIAGNOSIS — D849 Immunodeficiency, unspecified: Secondary | ICD-10-CM

## 2014-02-10 DIAGNOSIS — D899 Disorder involving the immune mechanism, unspecified: Secondary | ICD-10-CM | POA: Diagnosis present

## 2014-02-10 DIAGNOSIS — R0902 Hypoxemia: Secondary | ICD-10-CM

## 2014-02-10 DIAGNOSIS — Z79899 Other long term (current) drug therapy: Secondary | ICD-10-CM | POA: Diagnosis not present

## 2014-02-10 DIAGNOSIS — J8410 Pulmonary fibrosis, unspecified: Secondary | ICD-10-CM | POA: Diagnosis present

## 2014-02-10 DIAGNOSIS — F411 Generalized anxiety disorder: Secondary | ICD-10-CM | POA: Diagnosis present

## 2014-02-10 DIAGNOSIS — Z853 Personal history of malignant neoplasm of breast: Secondary | ICD-10-CM

## 2014-02-10 DIAGNOSIS — R0789 Other chest pain: Secondary | ICD-10-CM | POA: Diagnosis present

## 2014-02-10 DIAGNOSIS — R071 Chest pain on breathing: Secondary | ICD-10-CM | POA: Diagnosis present

## 2014-02-10 DIAGNOSIS — T394X5A Adverse effect of antirheumatics, not elsewhere classified, initial encounter: Secondary | ICD-10-CM | POA: Diagnosis present

## 2014-02-10 DIAGNOSIS — Z7901 Long term (current) use of anticoagulants: Secondary | ICD-10-CM

## 2014-02-10 DIAGNOSIS — J9690 Respiratory failure, unspecified, unspecified whether with hypoxia or hypercapnia: Secondary | ICD-10-CM

## 2014-02-10 DIAGNOSIS — J129 Viral pneumonia, unspecified: Principal | ICD-10-CM | POA: Diagnosis present

## 2014-02-10 DIAGNOSIS — R918 Other nonspecific abnormal finding of lung field: Secondary | ICD-10-CM

## 2014-02-10 DIAGNOSIS — J9601 Acute respiratory failure with hypoxia: Secondary | ICD-10-CM

## 2014-02-10 DIAGNOSIS — N390 Urinary tract infection, site not specified: Secondary | ICD-10-CM | POA: Diagnosis present

## 2014-02-10 DIAGNOSIS — Z86711 Personal history of pulmonary embolism: Secondary | ICD-10-CM | POA: Diagnosis not present

## 2014-02-10 DIAGNOSIS — I059 Rheumatic mitral valve disease, unspecified: Secondary | ICD-10-CM | POA: Diagnosis present

## 2014-02-10 DIAGNOSIS — J984 Other disorders of lung: Secondary | ICD-10-CM | POA: Diagnosis present

## 2014-02-10 DIAGNOSIS — M069 Rheumatoid arthritis, unspecified: Secondary | ICD-10-CM | POA: Diagnosis present

## 2014-02-10 DIAGNOSIS — J189 Pneumonia, unspecified organism: Secondary | ICD-10-CM | POA: Diagnosis present

## 2014-02-10 LAB — BASIC METABOLIC PANEL
Anion gap: 17 — ABNORMAL HIGH (ref 5–15)
BUN: 20 mg/dL (ref 6–23)
CALCIUM: 10.1 mg/dL (ref 8.4–10.5)
CO2: 23 meq/L (ref 19–32)
Chloride: 98 mEq/L (ref 96–112)
Creatinine, Ser: 1.28 mg/dL — ABNORMAL HIGH (ref 0.50–1.10)
GFR calc Af Amer: 47 mL/min — ABNORMAL LOW (ref >90)
GFR calc non Af Amer: 41 mL/min — ABNORMAL LOW (ref >90)
GLUCOSE: 178 mg/dL — AB (ref 70–99)
Potassium: 4.3 mEq/L (ref 3.7–5.3)
SODIUM: 138 meq/L (ref 137–147)

## 2014-02-10 LAB — URINALYSIS, ROUTINE W REFLEX MICROSCOPIC
BILIRUBIN URINE: NEGATIVE
Glucose, UA: NEGATIVE mg/dL
KETONES UR: NEGATIVE mg/dL
Leukocytes, UA: NEGATIVE
NITRITE: NEGATIVE
Protein, ur: NEGATIVE mg/dL
UROBILINOGEN UA: 0.2 mg/dL (ref 0.0–1.0)
pH: 5.5 (ref 5.0–8.0)

## 2014-02-10 LAB — I-STAT TROPONIN, ED: TROPONIN I, POC: 0 ng/mL (ref 0.00–0.08)

## 2014-02-10 LAB — CBC
HEMATOCRIT: 39.3 % (ref 36.0–46.0)
HEMOGLOBIN: 13.4 g/dL (ref 12.0–15.0)
MCH: 33.5 pg (ref 26.0–34.0)
MCHC: 34.1 g/dL (ref 30.0–36.0)
MCV: 98.3 fL (ref 78.0–100.0)
Platelets: 221 10*3/uL (ref 150–400)
RBC: 4 MIL/uL (ref 3.87–5.11)
RDW: 16.5 % — ABNORMAL HIGH (ref 11.5–15.5)
WBC: 5.2 10*3/uL (ref 4.0–10.5)

## 2014-02-10 LAB — PROTIME-INR
INR: 2.45 — AB (ref 0.00–1.49)
PROTHROMBIN TIME: 26.6 s — AB (ref 11.6–15.2)

## 2014-02-10 LAB — URINE MICROSCOPIC-ADD ON

## 2014-02-10 LAB — PRO B NATRIURETIC PEPTIDE: Pro B Natriuretic peptide (BNP): 437 pg/mL — ABNORMAL HIGH (ref 0–125)

## 2014-02-10 MED ORDER — IOHEXOL 350 MG/ML SOLN
100.0000 mL | Freq: Once | INTRAVENOUS | Status: AC | PRN
  Administered 2014-02-10: 100 mL via INTRAVENOUS

## 2014-02-10 NOTE — ED Provider Notes (Signed)
CSN: [id number]     Arrival date & time 02/10/14  1638 History   First MD Initiated Contact with Patient 02/10/14 1850     Chief Complaint  Patient presents with  . Chest Pain     (Consider location/radiation/quality/duration/timing/severity/associated sxs/prior Treatment) HPI Comments: Patient here complaining of persistent substernal chest discomfort that is been pleuritic x24 hours. She does have a history of pulmonary embolism but states that this is different. She has had a nonproductive cough. No fever or chills. Saw her Dr. yesterday and placed amoxicillin for UTI. Denies any lower extremity edema. Does note some dyspnea on exertion. Symptoms persistent. No recent travel history. Symptoms persistent and nothing makes them better. No specific treatment for this prior to arrival  Patient is a 73 y.o. female presenting with chest pain. The history is provided by the patient.  Chest Pain   Past Medical History  Diagnosis Date  . Rheumatoid arthritis(714.0)   . Pulmonary embolism 2009  . Breast cancer 2004  . Osteoporosis   . Lung nodules     Benign  . Thrombocytopenia   . Macular degeneration   . Mitral valve prolapse   . Palpitations   . Diverticulitis    Past Surgical History  Procedure Laterality Date  . Breast lumpectomy  2003    Left, Chemotherapy/Radiation  . Nuclear sress test  2008    EF 67%, Normal  . US echocardiography  2010    EF 55-60%, Mild Aortic Sclerosis, MVP, mild MR   Family History  Problem Relation Age of Onset  . Breast cancer Mother 34  . Breast cancer Sister 54   History  Substance Use Topics  . Smoking status: Never Smoker   . Smokeless tobacco: Not on file  . Alcohol Use: No   OB History   Grav Para Term Preterm Abortions TAB SAB Ect Mult Living                 Review of Systems  Cardiovascular: Positive for chest pain.  All other systems reviewed and are negative.     Allergies  Review of patient's allergies indicates no  known allergies.  Home Medications   Prior to Admission medications   Medication Sig Start Date End Date Taking? Authorizing Provider  acetaminophen (TYLENOL ARTHRITIS PAIN) 650 MG CR tablet Take 650 mg by mouth as needed for pain.    Yes Historical Provider, MD  ALPRAZolam Duanne Moron) 0.5 MG tablet Take 0.5 mg by mouth every 6 (six) hours as needed for anxiety or sleep.   Yes Historical Provider, MD  amoxicillin (AMOXIL) 875 MG tablet Take 875 mg by mouth 2 (two) times daily. 02/09/14  Yes Historical Provider, MD  B Complex-C (B-COMPLEX WITH VITAMIN C) tablet Take 1 tablet by mouth daily.   Yes Historical Provider, MD  Biotin 2500 MCG CAPS Take 5,000 mcg by mouth daily.    Yes Historical Provider, MD  Calcium Carbonate-Vitamin D (CALCIUM + D PO) Take 600 mg by mouth daily.    Yes Historical Provider, MD  Cholecalciferol (VITAMIN D PO) Take 1,000 Units by mouth 2 (two) times daily.    Yes Historical Provider, MD  digoxin (LANOXIN) 0.125 MG tablet Take 1 tablet (0.125 mg total) by mouth daily. 08/07/13  Yes Darlin Coco, MD  etanercept (ENBREL) 50 MG/ML injection Inject 50 mg into the skin once a week.    Yes Historical Provider, MD  folic acid (FOLVITE) 497 MCG tablet Take 400 mcg by mouth 2 (two) times  daily.   Yes Historical Provider, MD  methotrexate (RHEUMATREX) 2.5 MG tablet Take 2.5 mg by mouth See admin instructions. Only on Wednesday and Thursday 02/07/14  Yes Historical Provider, MD  Multiple Vitamins-Minerals (EYE-VITES) TABS Take by mouth daily.     Yes Historical Provider, MD  predniSONE (DELTASONE) 5 MG tablet Take 5 mg by mouth daily. 02/07/14  Yes Historical Provider, MD  propranolol (INDERAL) 20 MG tablet Take 1 tablet (20 mg total) by mouth 2 (two) times daily. 10/15/13  Yes Darlin Coco, MD  warfarin (COUMADIN) 5 MG tablet Take 5 mg by mouth daily. 5 mg daily expect Monday and Thursday 2.5 mg   Yes Historical Provider, MD  Teriparatide, Recombinant, (FORTEO) 600 MCG/2.4ML SOLN  Inject 20 mcg into the skin daily.    Historical Provider, MD   BP 111/59  Pulse 93  Temp(Src) 98.3 F (36.8 C) (Oral)  Resp 18  SpO2 93% Physical Exam  Nursing note and vitals reviewed. Constitutional: She is oriented to person, place, and time. She appears well-developed and well-nourished.  Non-toxic appearance. No distress.  HENT:  Head: Normocephalic and atraumatic.  Eyes: Conjunctivae, EOM and lids are normal. Pupils are equal, round, and reactive to light.  Neck: Normal range of motion. Neck supple. No tracheal deviation present. No mass present.  Cardiovascular: Normal rate, regular rhythm and normal heart sounds.  Exam reveals no gallop.   No murmur heard. Pulmonary/Chest: Effort normal. No stridor. No respiratory distress. She has decreased breath sounds. She has no wheezes. She has no rhonchi. She has no rales.  Abdominal: Soft. Normal appearance and bowel sounds are normal. She exhibits no distension. There is no tenderness. There is no rebound and no CVA tenderness.  Musculoskeletal: Normal range of motion. She exhibits no edema and no tenderness.  Neurological: She is alert and oriented to person, place, and time. She has normal strength. No cranial nerve deficit or sensory deficit. GCS eye subscore is 4. GCS verbal subscore is 5. GCS motor subscore is 6.  Skin: Skin is warm and dry. No abrasion and no rash noted.  Psychiatric: She has a normal mood and affect. Her speech is normal and behavior is normal.    ED Course  Procedures (including critical care time) Labs Review Labs Reviewed  CBC - Abnormal; Notable for the following:    RDW 16.5 (*)    All other components within normal limits  BASIC METABOLIC PANEL - Abnormal; Notable for the following:    Glucose, Bld 178 (*)    Creatinine, Ser 1.28 (*)    GFR calc non Af Amer 41 (*)    GFR calc Af Amer 47 (*)    Anion gap 17 (*)    All other components within normal limits  PRO B NATRIURETIC PEPTIDE - Abnormal;  Notable for the following:    Pro B Natriuretic peptide (BNP) 437.0 (*)    All other components within normal limits  PROTIME-INR - Abnormal; Notable for the following:    Prothrombin Time 26.6 (*)    INR 2.45 (*)    All other components within normal limits  I-STAT TROPOININ, ED    Imaging Review Dg Chest 2 View  02/10/2014   CLINICAL DATA:  Mid chest pain with tightness on inspiration for 1 day. History of pulmonary embolism and breast cancer.  EXAM: CHEST  2 VIEW  COMPARISON:  Radiographs 02/10/2014.  CT 07/10/2011.  FINDINGS: The heart size and mediastinal contours are stable without apparent adenopathy. The lungs  are hyperinflated. There is diffuse prominence of the bronchovascular markings which is progressive compared with prior radiographs. Scattered nodularity is grossly stable. There is no dominant lung mass or confluent airspace opacity. There is no significant pleural effusion. Postsurgical changes are present within the left breast. The bones appear unremarkable.  IMPRESSION: Progressive diffuse prominence of the bronchovascular markings compared with prior chest radiographs. This could indicate mild edema superimposed on emphysema. Underlying nodularity is grossly stable.   Electronically Signed   By: Camie Patience M.D.   On: 02/10/2014 18:50     EKG Interpretation   Date/Time:  Tuesday February 10 2014 16:48:16 EDT Ventricular Rate:  95 PR Interval:  147 QRS Duration: 72 QT Interval:  321 QTC Calculation: 403 R Axis:   41 Text Interpretation:  Sinus rhythm Probable left atrial enlargement  Nonspecific repol abnormality, lateral leads Confirmed by Nahiem Dredge  MD,  Mette Southgate ([id number]) on 02/10/2014 6:59:46 PM      MDM   Final diagnoses:  None    Patient's CT results noted and patient with likely early infection. She remains hypoxic and was placed on oxygen. No evidence of pulmonary embolism. Will be admitted to medicine for further evaluation    Leota Jacobsen, MD 02/10/14  2252

## 2014-02-10 NOTE — ED Notes (Addendum)
Pt c/o central chest pain x 1 das, c/o SOB with but no n/v or lightheadedness. Pt states it hurts when taking a deep breathe.

## 2014-02-10 NOTE — ED Notes (Signed)
Pt reports fever on Friday, seen by PCP and told it was viral. Urine sample came back positive for UTI so pt started on amoxicillin today.

## 2014-02-11 DIAGNOSIS — J9600 Acute respiratory failure, unspecified whether with hypoxia or hypercapnia: Secondary | ICD-10-CM

## 2014-02-11 DIAGNOSIS — D849 Immunodeficiency, unspecified: Secondary | ICD-10-CM

## 2014-02-11 DIAGNOSIS — J9601 Acute respiratory failure with hypoxia: Secondary | ICD-10-CM

## 2014-02-11 DIAGNOSIS — J129 Viral pneumonia, unspecified: Secondary | ICD-10-CM | POA: Diagnosis present

## 2014-02-11 DIAGNOSIS — M069 Rheumatoid arthritis, unspecified: Secondary | ICD-10-CM

## 2014-02-11 DIAGNOSIS — R918 Other nonspecific abnormal finding of lung field: Secondary | ICD-10-CM

## 2014-02-11 LAB — CBC
HEMATOCRIT: 35 % — AB (ref 36.0–46.0)
Hemoglobin: 11.9 g/dL — ABNORMAL LOW (ref 12.0–15.0)
MCH: 32.7 pg (ref 26.0–34.0)
MCHC: 34 g/dL (ref 30.0–36.0)
MCV: 96.2 fL (ref 78.0–100.0)
PLATELETS: 207 10*3/uL (ref 150–400)
RBC: 3.64 MIL/uL — ABNORMAL LOW (ref 3.87–5.11)
RDW: 16.4 % — ABNORMAL HIGH (ref 11.5–15.5)
WBC: 6.6 10*3/uL (ref 4.0–10.5)

## 2014-02-11 LAB — BASIC METABOLIC PANEL
ANION GAP: 12 (ref 5–15)
BUN: 24 mg/dL — ABNORMAL HIGH (ref 6–23)
CO2: 27 meq/L (ref 19–32)
Calcium: 9.6 mg/dL (ref 8.4–10.5)
Chloride: 102 mEq/L (ref 96–112)
Creatinine, Ser: 1.32 mg/dL — ABNORMAL HIGH (ref 0.50–1.10)
GFR calc Af Amer: 45 mL/min — ABNORMAL LOW (ref >90)
GFR, EST NON AFRICAN AMERICAN: 39 mL/min — AB (ref >90)
Glucose, Bld: 110 mg/dL — ABNORMAL HIGH (ref 70–99)
POTASSIUM: 4 meq/L (ref 3.7–5.3)
SODIUM: 141 meq/L (ref 137–147)

## 2014-02-11 LAB — RHEUMATOID FACTOR: Rhuematoid fact SerPl-aCnc: 21 IU/mL — ABNORMAL HIGH (ref <=14)

## 2014-02-11 LAB — PROTIME-INR
INR: 2.61 — AB (ref 0.00–1.49)
Prothrombin Time: 27.9 seconds — ABNORMAL HIGH (ref 11.6–15.2)

## 2014-02-11 MED ORDER — ONDANSETRON HCL 4 MG PO TABS: 4.0000 mg | ORAL_TABLET | Freq: Four times a day (QID) | ORAL | Status: DC | PRN

## 2014-02-11 MED ORDER — IPRATROPIUM-ALBUTEROL 0.5-2.5 (3) MG/3ML IN SOLN: 3.0000 mL | RESPIRATORY_TRACT | Status: DC | PRN

## 2014-02-11 MED ORDER — ACETAMINOPHEN 325 MG PO TABS: 650.0000 mg | ORAL_TABLET | ORAL | Status: DC | PRN

## 2014-02-11 MED ORDER — AMOXICILLIN 250 MG PO CAPS
750.0000 mg | ORAL_CAPSULE | Freq: Two times a day (BID) | ORAL | Status: DC
  Administered 2014-02-11: 750 mg via ORAL
  Filled 2014-02-11 (×3): qty 3

## 2014-02-11 MED ORDER — B COMPLEX-C PO TABS
1.0000 | ORAL_TABLET | Freq: Every day | ORAL | Status: DC
  Administered 2014-02-11 – 2014-02-13 (×3): 1 via ORAL
  Filled 2014-02-11 (×3): qty 1

## 2014-02-11 MED ORDER — ONDANSETRON HCL 4 MG/2ML IJ SOLN: 4.0000 mg | Freq: Four times a day (QID) | INTRAMUSCULAR | Status: DC | PRN

## 2014-02-11 MED ORDER — IPRATROPIUM-ALBUTEROL 0.5-2.5 (3) MG/3ML IN SOLN: 3.0000 mL | Freq: Four times a day (QID) | RESPIRATORY_TRACT | Status: DC

## 2014-02-11 MED ORDER — METHYLPREDNISOLONE SODIUM SUCC 40 MG IJ SOLR
40.0000 mg | Freq: Four times a day (QID) | INTRAMUSCULAR | Status: DC
  Administered 2014-02-11 – 2014-02-13 (×8): 40 mg via INTRAVENOUS
  Filled 2014-02-11 (×12): qty 1

## 2014-02-11 MED ORDER — WARFARIN - PHARMACIST DOSING INPATIENT: Freq: Every day | Status: DC

## 2014-02-11 MED ORDER — WARFARIN SODIUM 5 MG PO TABS
5.0000 mg | ORAL_TABLET | Freq: Every day | ORAL | Status: DC
  Filled 2014-02-11: qty 1

## 2014-02-11 MED ORDER — FOLIC ACID 0.5 MG HALF TAB
500.0000 ug | ORAL_TABLET | Freq: Two times a day (BID) | ORAL | Status: DC
  Administered 2014-02-11 – 2014-02-13 (×5): 0.5 mg via ORAL
  Filled 2014-02-11 (×6): qty 1

## 2014-02-11 MED ORDER — IPRATROPIUM-ALBUTEROL 0.5-2.5 (3) MG/3ML IN SOLN
3.0000 mL | RESPIRATORY_TRACT | Status: DC
  Administered 2014-02-11 (×2): 3 mL via RESPIRATORY_TRACT
  Filled 2014-02-11 (×2): qty 3

## 2014-02-11 MED ORDER — CALCIUM CARBONATE-VITAMIN D 250-125 MG-UNIT PO TABS
1.0000 | ORAL_TABLET | Freq: Every day | ORAL | Status: DC
  Administered 2014-02-11 – 2014-02-12 (×2): 1 via ORAL
  Administered 2014-02-13: 10:00:00 via ORAL
  Filled 2014-02-11 (×3): qty 1

## 2014-02-11 MED ORDER — ENOXAPARIN SODIUM 40 MG/0.4ML ~~LOC~~ SOLN: 40.0000 mg | SUBCUTANEOUS | Status: DC

## 2014-02-11 MED ORDER — ALPRAZOLAM 0.5 MG PO TABS: 0.5000 mg | ORAL_TABLET | Freq: Four times a day (QID) | ORAL | Status: DC | PRN

## 2014-02-11 MED ORDER — BIOTIN 2500 MCG PO CAPS: 5000.0000 ug | ORAL_CAPSULE | Freq: Every day | ORAL | Status: DC

## 2014-02-11 MED ORDER — PREDNISONE 5 MG PO TABS
5.0000 mg | ORAL_TABLET | Freq: Every day | ORAL | Status: DC
  Filled 2014-02-11: qty 1

## 2014-02-11 MED ORDER — DEXTROSE 5 % IV SOLN
1.0000 g | INTRAVENOUS | Status: DC
  Administered 2014-02-11 – 2014-02-13 (×3): 1 g via INTRAVENOUS
  Filled 2014-02-11 (×3): qty 10

## 2014-02-11 MED ORDER — PROPRANOLOL HCL 20 MG PO TABS
20.0000 mg | ORAL_TABLET | Freq: Two times a day (BID) | ORAL | Status: DC
  Administered 2014-02-11 – 2014-02-13 (×6): 20 mg via ORAL
  Filled 2014-02-11 (×7): qty 1

## 2014-02-11 MED ORDER — WARFARIN - PHYSICIAN DOSING INPATIENT
Freq: Every day | Status: DC
Start: 2014-02-11 — End: 2014-02-11

## 2014-02-11 MED ORDER — GANCICLOVIR SODIUM 500 MG IV SOLR
2.5000 mg/kg | Freq: Every day | INTRAVENOUS | Status: DC
  Administered 2014-02-11: 145 mg via INTRAVENOUS
  Filled 2014-02-11: qty 145

## 2014-02-11 MED ORDER — DIGOXIN 125 MCG PO TABS
0.1250 mg | ORAL_TABLET | Freq: Every day | ORAL | Status: DC
  Administered 2014-02-11 – 2014-02-12 (×3): 0.125 mg via ORAL
  Filled 2014-02-11 (×4): qty 1

## 2014-02-11 MED ORDER — DEXTROSE 5 % IV SOLN
500.0000 mg | INTRAVENOUS | Status: DC
  Administered 2014-02-11 – 2014-02-13 (×3): 500 mg via INTRAVENOUS
  Filled 2014-02-11 (×3): qty 500

## 2014-02-11 MED ORDER — PROSIGHT PO TABS
1.0000 | ORAL_TABLET | Freq: Every day | ORAL | Status: DC
  Administered 2014-02-11 – 2014-02-12 (×3): 1 via ORAL
  Filled 2014-02-11 (×4): qty 1

## 2014-02-11 MED ORDER — WARFARIN SODIUM 5 MG PO TABS
5.0000 mg | ORAL_TABLET | ORAL | Status: DC
  Administered 2014-02-11 (×2): 5 mg via ORAL
  Filled 2014-02-11 (×3): qty 1

## 2014-02-11 MED ORDER — METHOTREXATE 2.5 MG PO TABS: 2.5000 mg | ORAL_TABLET | ORAL | Status: DC

## 2014-02-11 MED ORDER — WARFARIN SODIUM 2.5 MG PO TABS
2.5000 mg | ORAL_TABLET | ORAL | Status: DC
  Administered 2014-02-12: 2.5 mg via ORAL
  Filled 2014-02-11: qty 1

## 2014-02-11 NOTE — Progress Notes (Signed)
[facility] PROGRESS NOTE  Cathy Leonard EPP:[id number] DOB: [birth year] DOA: 02/10/2014 PCP: Cari Caraway, MD  Assessment/Plan: 1-Acute hypoxic Respiratory Failure: CTA with ground glass changes. She has history of RA. She is on prednisone and Embrel.  I will start Ceftriaxone and Azithromycin to cover for infectious process.  Will continue for now with Ganciclovir, until pulmonologist evaluation.  Will consult Pulmonary for further evaluation recommendation. Does she needs bronchoscopy ? Marland Kitchen    2-History of PE;  On coumadin. Will ask pharmacy to does. Might need to hold coumadin if pulmonary planning to do bronchoscopy.   3-RA; Hold Enbrel. Continue with prednisone. Hold methotrexate.   4-History of mitral valve prolapse: on propanolol.    Code Status: Full Code.  Family Communication: Care discussed with patient.  Disposition Plan: Remain inpatient.    Consultants:  Pulmonary  Procedures:  none  Antibiotics:  Ceftriaxone 7-22  Azithromycin 7-22  Ganciclovir 7-22    HPI/Subjective: Still not feeling well. Relates SOB, dry cough. She has been having cough and SOB for last 3 weeks. Relates chills.   Objective: Filed Vitals:   02/11/14 0513  BP: 111/65  Pulse: 74  Temp: 98.9 F (37.2 C)  Resp: 20    Intake/Output Summary (Last 24 hours) at 02/11/14 0829 Last data filed at 02/11/14 0600  Gross per 24 hour  Intake    340 ml  Output      0 ml  Net    340 ml   Filed Weights   02/10/14 2359  Weight: 57.5 kg (126 lb 12.2 oz)    Exam:   General:  Alert, in no distress.   Cardiovascular: S 1, S 2 RRR  Respiratory: Bilateral crackles worse bases.   Abdomen: BS present, soft, NT  Musculoskeletal: no edema.   Data Reviewed: Basic Metabolic Panel:  Recent Labs Lab 02/10/14 1652 02/11/14 0405  NA 138 141  K 4.3 4.0  CL 98 102  CO2 23 27  GLUCOSE 178* 110*  BUN 20 24*  CREATININE 1.28* 1.32*  CALCIUM 10.1 9.6   Liver Function  Tests: No results found for this basename: AST, ALT, ALKPHOS, BILITOT, PROT, ALBUMIN,  in the last 168 hours No results found for this basename: LIPASE, AMYLASE,  in the last 168 hours No results found for this basename: AMMONIA,  in the last 168 hours CBC:  Recent Labs Lab 02/10/14 1652 02/11/14 0405  WBC 5.2 6.6  HGB 13.4 11.9*  HCT 39.3 35.0*  MCV 98.3 96.2  PLT 221 207   Cardiac Enzymes: No results found for this basename: CKTOTAL, CKMB, CKMBINDEX, TROPONINI,  in the last 168 hours BNP (last 3 results)  Recent Labs  02/10/14 1653  PROBNP 437.0*   CBG: No results found for this basename: GLUCAP,  in the last 168 hours  No results found for this or any previous visit (from the past 240 hour(s)).   Studies: Dg Chest 2 View  02/10/2014   CLINICAL DATA:  Mid chest pain with tightness on inspiration for 1 day. History of pulmonary embolism and breast cancer.  EXAM: CHEST  2 VIEW  COMPARISON:  Radiographs 02/10/2014.  CT 07/10/2011.  FINDINGS: The heart size and mediastinal contours are stable without apparent adenopathy. The lungs are hyperinflated. There is diffuse prominence of the bronchovascular markings which is progressive compared with prior radiographs. Scattered nodularity is grossly stable. There is no dominant lung mass or confluent airspace opacity. There is no significant pleural effusion. Postsurgical changes are present within the  left breast. The bones appear unremarkable.  IMPRESSION: Progressive diffuse prominence of the bronchovascular markings compared with prior chest radiographs. This could indicate mild edema superimposed on emphysema. Underlying nodularity is grossly stable.   Electronically Signed   By: Camie Patience M.D.   On: 02/10/2014 18:50   Ct Angio Chest Pe W/cm &/or Wo Cm  02/10/2014   CLINICAL DATA:  Central chest pain for 1 day complaining of shortness of breath, pain with deep inspiration  EXAM: CT ANGIOGRAPHY CHEST WITH CONTRAST  TECHNIQUE:  Multidetector CT imaging of the chest was performed using the standard protocol during bolus administration of intravenous contrast. Multiplanar CT image reconstructions and MIPs were obtained to evaluate the vascular anatomy.  CONTRAST:  139mL OMNIPAQUE IOHEXOL 350 MG/ML SOLN  COMPARISON:  02/10/2014 radiograph, 10/26/2012 CT abdomen pelvis, 07/10/2011 CT thorax  FINDINGS: There is no filling defects in the pulmonary arterial system. There is no evidence of thoracic aortic dissection or dilatation. There is no pleural or pericardial effusion.  Thoracic inlet is normal. There are several mediastinal lymph nodes. These are increased in size when compared to the prior study of 07/10/2011, with the largest in the sub- carinal region measuring 18 mm. There is also increased lymphoid tissue with numerous small lymph nodes in both hila, represent again increased in number and conspicuity when compared to the prior study of 07/10/2011.  There is an 8 mm pulmonary nodule associated with the fissure on the right, at the level of the hilum. This is stable from 2012. Further inferiorly, there is an 8 mm pulmonary nodule that is pleural based laterally in the right lower lobe. This is also stable from 2012. Finally, there is a 6 mm pulmonary nodule in the extreme inferior right lung base that is stable from 2012 as well. Given the stability, these are felt to be benign.  There is moderately severe diffuse bilateral patchy ground-glass attenuation. This is new from prior studies.  There are no acute musculoskeletal findings. Scans through the upper abdomen are unremarkable.  Review of the MIP images confirms the above findings.  IMPRESSION: Moderately severe diffuse bilateral ground-glass parenchymal attenuation. This is new from 2012, and, as best as can be determined comparing to the portions of the lung bases scanned in 2014, new from that study as well. Differential diagnostic possibilities include opportunistic infection  such as with CMV and HSV, eosinophilic pneumonia, and hypersensitivity pneumonitis. Other possible causes of ground-glass attenuation including adenocarcinoma and pulmonary edema appear less likely on the current exam.  Mediastinal and hilar adenopathy noted, possibly reactive.   Electronically Signed   By: Skipper Cliche M.D.   On: 02/10/2014 22:04    Scheduled Meds: . amoxicillin  750 mg Oral BID  . B-complex with vitamin C  1 tablet Oral Daily  . calcium-vitamin D  1 tablet Oral Daily  . digoxin  0.125 mg Oral Daily  . folic acid  301 mcg Oral BID  . ganciclovir (CYTOVENE) IV  2.5 mg/kg Intravenous QHS  . ipratropium-albuterol  3 mL Nebulization Q6H  . multivitamin  1 tablet Oral Daily  . propranolol  20 mg Oral BID  . [START ON 02/12/2014] warfarin  2.5 mg Oral Once per day on Mon Thu  . warfarin  5 mg Oral Once per day on Sun Tue Wed Fri Sat  . Warfarin - Physician Dosing Inpatient   Does not apply q1800   Continuous Infusions:   Active Problems:   Anxiety state, unspecified   Long  term current use of anticoagulant   Rheumatoid arthritis(714.0)   Pulmonary infection   Immunocompromised state   Respiratory failure   Viral pneumonitis    Time spent: 35 minutes.     Niel Hummer A  [facility] Pager [phone]. If 7PM-7AM, please contact night-coverage at [email or web address], password [facility] 02/11/2014, 8:29 AM  LOS: 1 day

## 2014-02-11 NOTE — Progress Notes (Signed)
PHARMACIST - PHYSICIAN ORDER COMMUNICATION  CONCERNING: P&T Medication Policy on Herbal Medications  DESCRIPTION:  This patient's order for:  Biotin   has been noted.  This product(s) is classified as an "herbal" or natural product. Due to a lack of definitive safety studies or FDA approval, nonstandard manufacturing practices, plus the potential risk of unknown drug-drug interactions while on inpatient medications, the Pharmacy and Therapeutics Committee does not permit the use of "herbal" or natural products of this type within [facility].   ACTION TAKEN: The pharmacy department is unable to verify this order at this time and your patient has been informed of this safety policy. Please reevaluate patient's clinical condition at discharge and address if the herbal or natural product(s) should be resumed at that time.  Dorrene German 02/11/2014 1:13 AM

## 2014-02-11 NOTE — Progress Notes (Signed)
Rx Brief note:  Re holding Methotrexate  Methotrexate (Trexall; Rheumatrex) hold criteria  Hgb < 8  WBC < 3  Pltc < 100K  SCr > 1.5x baseline (or > 2 if baseline unknown)  AST or ALT >3x ULN  Bili > 1.5x ULN  Ascites or pleural effusion  Diarrhea - Grade 2 or higher  Ulcerative stomatitis  Unexplained pneumonitis / hypoxemia  Will hold methotrexate based on unexplained hypoxemia. Please resume when appropriate.  Thanks, Dorrene German 02/11/2014 6:17 AM

## 2014-02-11 NOTE — H&P (Signed)
Cathy Leonard is an 73 y.o. female.   Chief Complaint: Shortness of breath and cough. HPI: Pt is a 74 yr old woman with known RA for which she takes Embrel and prednisone. She states that she started not feeling well last Friday with primarily extreme fatigue. She visited her PCP who placed her on antibiotics for a UTI.  Over the weekend she developed worsening shortness of breath, cough, and wheezes.  She has had fevers and chills. She is found to be significantly hypoxic in the ED.  Past Medical History  Diagnosis Date  . Rheumatoid arthritis(714.0)   . Pulmonary embolism 2009  . Breast cancer 2004  . Osteoporosis   . Lung nodules     Benign  . Thrombocytopenia   . Macular degeneration   . Mitral valve prolapse   . Palpitations   . Diverticulitis     Past Surgical History  Procedure Laterality Date  . Breast lumpectomy  2003    Left, Chemotherapy/Radiation  . Nuclear sress test  2008    EF 67%, Normal  . US echocardiography  2010    EF 55-60%, Mild Aortic Sclerosis, MVP, mild MR    Family History  Problem Relation Age of Onset  . Breast cancer Mother 49  . Breast cancer Sister 43   Social History:  reports that she has never smoked. She does not have any smokeless tobacco history on file. She reports that she does not drink alcohol. Her drug history is not on file.  Allergies: No Known Allergies  Medications Prior to Admission  Medication Sig Dispense Refill  . acetaminophen (TYLENOL ARTHRITIS PAIN) 650 MG CR tablet Take 650 mg by mouth as needed for pain.       Marland Kitchen ALPRAZolam (XANAX) 0.5 MG tablet Take 0.5 mg by mouth every 6 (six) hours as needed for anxiety or sleep.      Marland Kitchen amoxicillin (AMOXIL) 875 MG tablet Take 875 mg by mouth 2 (two) times daily.      . B Complex-C (B-COMPLEX WITH VITAMIN C) tablet Take 1 tablet by mouth daily.      . Biotin 2500 MCG CAPS Take 5,000 mcg by mouth daily.       . Calcium Carbonate-Vitamin D (CALCIUM + D PO) Take 600 mg by mouth daily.        . Cholecalciferol (VITAMIN D PO) Take 1,000 Units by mouth 2 (two) times daily.       . digoxin (LANOXIN) 0.125 MG tablet Take 1 tablet (0.125 mg total) by mouth daily.  90 tablet  3  . etanercept (ENBREL) 50 MG/ML injection Inject 50 mg into the skin once a week.       . folic acid (FOLVITE) 681 MCG tablet Take 400 mcg by mouth 2 (two) times daily.      . methotrexate (RHEUMATREX) 2.5 MG tablet Take 2.5 mg by mouth See admin instructions. Only on Wednesday and Thursday      . Multiple Vitamins-Minerals (EYE-VITES) TABS Take by mouth daily.        . predniSONE (DELTASONE) 5 MG tablet Take 5 mg by mouth daily.      . propranolol (INDERAL) 20 MG tablet Take 1 tablet (20 mg total) by mouth 2 (two) times daily.  180 tablet  1  . warfarin (COUMADIN) 5 MG tablet Take 5 mg by mouth daily. 5 mg daily expect Monday and Thursday 2.5 mg      . Teriparatide, Recombinant, (FORTEO) 600 MCG/2.4ML SOLN Inject  20 mcg into the skin daily.        Results for orders placed during the hospital encounter of 02/10/14 (from the past 48 hour(s))  CBC     Status: Abnormal   Collection Time    02/10/14  4:52 PM      Result Value Ref Range   WBC 5.2  4.0 - 10.5 K/uL   RBC 4.00  3.87 - 5.11 MIL/uL   Hemoglobin 13.4  12.0 - 15.0 g/dL   HCT 61.9  88.2 - 20.8 %   MCV 98.3  78.0 - 100.0 fL   MCH 33.5  26.0 - 34.0 pg   MCHC 34.1  30.0 - 36.0 g/dL   RDW 86.8 (*) 55.2 - 50.6 %   Platelets 221  150 - 400 K/uL  BASIC METABOLIC PANEL     Status: Abnormal   Collection Time    02/10/14  4:52 PM      Result Value Ref Range   Sodium 138  137 - 147 mEq/L   Potassium 4.3  3.7 - 5.3 mEq/L   Chloride 98  96 - 112 mEq/L   CO2 23  19 - 32 mEq/L   Glucose, Bld 178 (*) 70 - 99 mg/dL   BUN 20  6 - 23 mg/dL   Creatinine, Ser 0.49 (*) 0.50 - 1.10 mg/dL   Calcium 33.1  8.4 - 99.1 mg/dL   GFR calc non Af Amer 41 (*) >90 mL/min   GFR calc Af Amer 47 (*) >90 mL/min   Comment: (NOTE)     The eGFR has been calculated using the  CKD EPI equation.     This calculation has not been validated in all clinical situations.     eGFR's persistently <90 mL/min signify possible Chronic Kidney     Disease.   Anion gap 17 (*) 5 - 15  PROTIME-INR     Status: Abnormal   Collection Time    02/10/14  4:52 PM      Result Value Ref Range   Prothrombin Time 26.6 (*) 11.6 - 15.2 seconds   INR 2.45 (*) 0.00 - 1.49  PRO B NATRIURETIC PEPTIDE     Status: Abnormal   Collection Time    02/10/14  4:53 PM      Result Value Ref Range   Pro B Natriuretic peptide (BNP) 437.0 (*) 0 - 125 pg/mL  I-STAT TROPOININ, ED     Status: None   Collection Time    02/10/14  4:59 PM      Result Value Ref Range   Troponin i, poc 0.00  0.00 - 0.08 ng/mL   Comment 3            Comment: Due to the release kinetics of cTnI,     a negative result within the first hours     of the onset of symptoms does not rule out     myocardial infarction with certainty.     If myocardial infarction is still suspected,     repeat the test at appropriate intervals.  URINALYSIS, ROUTINE W REFLEX MICROSCOPIC     Status: Abnormal   Collection Time    02/10/14 11:11 PM      Result Value Ref Range   Color, Urine YELLOW  YELLOW   APPearance CLEAR  CLEAR   Specific Gravity, Urine >1.046 (*) 1.005 - 1.030   pH 5.5  5.0 - 8.0   Glucose, UA NEGATIVE  NEGATIVE mg/dL   Hgb  urine dipstick TRACE (*) NEGATIVE   Bilirubin Urine NEGATIVE  NEGATIVE   Ketones, ur NEGATIVE  NEGATIVE mg/dL   Protein, ur NEGATIVE  NEGATIVE mg/dL   Urobilinogen, UA 0.2  0.0 - 1.0 mg/dL   Nitrite NEGATIVE  NEGATIVE   Leukocytes, UA NEGATIVE  NEGATIVE  URINE MICROSCOPIC-ADD ON     Status: None   Collection Time    02/10/14 11:11 PM      Result Value Ref Range   Squamous Epithelial / LPF RARE  RARE   RBC / HPF 0-2  <3 RBC/hpf   Dg Chest 2 View  02/10/2014   CLINICAL DATA:  Mid chest pain with tightness on inspiration for 1 day. History of pulmonary embolism and breast cancer.  EXAM: CHEST  2 VIEW   COMPARISON:  Radiographs 02/10/2014.  CT 07/10/2011.  FINDINGS: The heart size and mediastinal contours are stable without apparent adenopathy. The lungs are hyperinflated. There is diffuse prominence of the bronchovascular markings which is progressive compared with prior radiographs. Scattered nodularity is grossly stable. There is no dominant lung mass or confluent airspace opacity. There is no significant pleural effusion. Postsurgical changes are present within the left breast. The bones appear unremarkable.  IMPRESSION: Progressive diffuse prominence of the bronchovascular markings compared with prior chest radiographs. This could indicate mild edema superimposed on emphysema. Underlying nodularity is grossly stable.   Electronically Signed   By: Roxy Horseman M.D.   On: 02/10/2014 18:50   Ct Angio Chest Pe W/cm &/or Wo Cm  02/10/2014   CLINICAL DATA:  Central chest pain for 1 day complaining of shortness of breath, pain with deep inspiration  EXAM: CT ANGIOGRAPHY CHEST WITH CONTRAST  TECHNIQUE: Multidetector CT imaging of the chest was performed using the standard protocol during bolus administration of intravenous contrast. Multiplanar CT image reconstructions and MIPs were obtained to evaluate the vascular anatomy.  CONTRAST:  OMNIPAQUE IOHEXOL 350 MG/ML SOLN  COMPARISON:  02/10/2014 radiograph, 10/26/2012 CT abdomen pelvis, 07/10/2011 CT thorax  FINDINGS: There is no filling defects in the pulmonary arterial system. There is no evidence of thoracic aortic dissection or dilatation. There is no pleural or pericardial effusion.  Thoracic inlet is normal. There are several mediastinal lymph nodes. These are increased in size when compared to the prior study of 07/10/2011, with the largest in the sub- carinal region measuring 18 mm. There is also increased lymphoid tissue with numerous small lymph nodes in both hila, represent again increased in number and conspicuity when compared to the prior study  of 07/10/2011.  There is an 8 mm pulmonary nodule associated with the fissure on the right, at the level of the hilum. This is stable from 2012. Further inferiorly, there is an 8 mm pulmonary nodule that is pleural based laterally in the right lower lobe. This is also stable from 2012. Finally, there is a 6 mm pulmonary nodule in the extreme inferior right lung base that is stable from 2012 as well. Given the stability, these are felt to be benign.  There is moderately severe diffuse bilateral patchy ground-glass attenuation. This is new from prior studies.  There are no acute musculoskeletal findings. Scans through the upper abdomen are unremarkable.  Review of the MIP images confirms the above findings.  IMPRESSION: Moderately severe diffuse bilateral ground-glass parenchymal attenuation. This is new from 2012, and, as best as can be determined comparing to the portions of the lung bases scanned in 2014, new from that study as well. Differential diagnostic  possibilities include opportunistic infection such as with CMV and HSV, eosinophilic pneumonia, and hypersensitivity pneumonitis. Other possible causes of ground-glass attenuation including adenocarcinoma and pulmonary edema appear less likely on the current exam.  Mediastinal and hilar adenopathy noted, possibly reactive.   Electronically Signed   By: Skipper Cliche M.D.   On: 02/10/2014 22:04    Review of Systems  Constitutional: Positive for fever, chills and malaise/fatigue.  HENT: Negative for congestion and sore throat.   Eyes: Negative for blurred vision, double vision and redness.  Respiratory: Positive for cough, sputum production, shortness of breath and wheezing. Negative for hemoptysis.   Cardiovascular: Negative for chest pain, palpitations, orthopnea, leg swelling and PND.  Gastrointestinal: Negative for heartburn, nausea, vomiting, abdominal pain, diarrhea, constipation and blood in stool.  Genitourinary: Negative for dysuria, frequency  and hematuria.  Skin: Negative for itching and rash.  Neurological: Positive for weakness. Negative for dizziness, tingling, tremors, seizures and headaches.  Endo/Heme/Allergies: Negative for environmental allergies. Does not bruise/bleed easily.  Psychiatric/Behavioral: Negative for depression, suicidal ideas and substance abuse. The patient is not nervous/anxious.     Blood pressure 120/66, pulse 83, temperature 97.7 F (36.5 C), temperature source Oral, resp. rate 20, height $RemoveBe'5\' 4"'BSVyhcFyd$  (1.626 m), weight 57.5 kg (126 lb 12.2 oz), SpO2 97.00%. Physical Exam  Constitutional: She is oriented to person, place, and time. She appears well-developed and well-nourished.  HENT:  Head: Normocephalic and atraumatic.  Mouth/Throat: No oropharyngeal exudate.  Eyes: Conjunctivae and EOM are normal. Pupils are equal, round, and reactive to light. Right eye exhibits no discharge. Left eye exhibits no discharge. No scleral icterus.  Neck: No JVD present. No tracheal deviation present. No thyromegaly present.  Cardiovascular: Normal rate, regular rhythm and normal heart sounds.  Exam reveals no gallop and no friction rub.   No murmur heard. Respiratory: No stridor. She is in respiratory distress. She has wheezes. She has rales.  GI: Soft. She exhibits no distension and no mass. There is no tenderness. There is no rebound and no guarding.  Lymphadenopathy:    She has no cervical adenopathy.  Neurological: She is alert and oriented to person, place, and time. She has normal reflexes.  Skin: Skin is warm and dry.  Psychiatric: She has a normal mood and affect. Her behavior is normal. Thought content normal.     Assessment/Plan 1. Lung infection - most likely oportunistic and viral.  Pt will receive Ganciclovir.  She should have a pulmonary consult, as she may benefit from bronchoscopy. 2. Immunosuppressed state as a result of Embrel and prednisone prescribed for her RA. These medications will be held until the  infection is controlled. 3. Rheumatoid arthritis  4.History of pulmonary embolus. - continue anticoagulation.  Cailyn Houdek 02/11/2014, 1:55 AM

## 2014-02-11 NOTE — Progress Notes (Addendum)
ANTICOAGULATION CONSULT NOTE - Initial Consult  Pharmacy Consult for warfarin Indication: PE 2009  No Known Allergies  Patient Measurements: Height: 5\' 4"  (162.6 cm) Weight: 126 lb 12.2 oz (57.5 kg) IBW/kg (Calculated) : 54.7 Heparin Dosing Weight:   Vital Signs: Temp: 98.2 F (36.8 C) (07/22 0842) Temp src: Oral (07/22 0842) BP: 111/65 mmHg (07/22 0513) Pulse Rate: 74 (07/22 0513)  Labs:  Recent Labs  02/10/14 1652 02/11/14 0405  HGB 13.4 11.9*  HCT 39.3 35.0*  PLT 221 207  LABPROT 26.6* 27.9*  INR 2.45* 2.61*  CREATININE 1.28* 1.32*    Estimated Creatinine Clearance: 33.3 ml/min (by C-G formula based on Cr of 1.32).   Medical History: Past Medical History  Diagnosis Date  . Rheumatoid arthritis(714.0)   . Pulmonary embolism 2009  . Breast cancer 2004  . Osteoporosis   . Lung nodules     Benign  . Thrombocytopenia   . Macular degeneration   . Mitral valve prolapse   . Palpitations   . Diverticulitis     Assessment: 44 YOF presents withSOB.  On warfarin for h/o PE in '09.  INR therapeutic at admission.  Home dose: 5mg  daily except 2.5mg  MonThur (LD 7/20)  Today's INR = 2.61  CBC: Hgb = 11.9, pltc WNL  Diet: regular  Goal of Therapy:  INR 2-3  Plan:   Warfarin 5mg  po x tonight as per current orders  Daily INR  Doreene Eland, PharmD, BCPS.   Pager: [phone]  02/11/2014,8:56 AM

## 2014-02-11 NOTE — Consult Note (Signed)
PULMONARY / CRITICAL CARE MEDICINE   Name: Cathy Leonard MRN: [id number] DOB: 1941/03/27    ADMISSION DATE:  02/10/2014 CONSULTATION DATE: 02/11/2014  REFERRING MD : Tyrell Antonio  CHIEF COMPLAINT: Sternal chest tightness and shortness of breath  INITIAL PRESENTATION: 73 y.o. Female with pmh of RA, PE, Breast CA, and lung nodules who was admitted 7/21 for acute hypoxic respiratory failure secondary to possible viral CAP.   STUDIES:  CTA chest 7/21: new moderate-severe diffuse bilateral ground-glass parenchymal attenuation  SIGNIFICANT EVENTS: 7/21 Admit 7/22 Start high dose steroids   HISTORY OF PRESENT ILLNESS:  73 y.o. Female with pmh of RA, PE, Breast CA, and lung nodules who presented to [facility] on 7/21 for increasing shortness of breath and sternal chest tightness. In the ED she had a CT angio of her chest which showed a new moderate-severe diffuse bilateral ground-glass parenchymal attenuation indicating possible viral pneumonia. She was admitted for acute hypoxic respiratory failure and treated for CAP including HSV and CMV. PCCM consulted for management of CAP and possible bronch to further evaluate etiology CAP and of hypoxic respiratory failure.  Of note, the patient visited her PCP on 7/17 for a UTI and was started on abx, she has also had a cough, headache, and chills for the past week.  She is followed by Dr. Darlin Coco.   REVIEW OF SYSTEMS:   General: positive for  - chills and fatigue Respiratory: positive for - cough, pleuritic pain and shortness of breath Cardiovascular: positive for - chest pain, dyspnea on exertion, irregular heartbeat and shortness of breath Gastrointestinal: no abdominal pain, change in bowel habits, or black or bloody stools Musculoskeletal: negative Neurological: no TIA or stroke symptoms Dermatological: negative  SUBJECTIVE:  Patient states she does not feel any better then yesterday.  She continues to have sternal chest tightness with  inhalation and shortness of breath.  VITAL SIGNS: Temp:  [97.7 F (36.5 C)-98.9 F (37.2 C)] 98.2 F (36.8 C) (07/22 0842) Pulse Rate:  [74-93] 74 (07/22 0513) Resp:  [16-20] 20 (07/22 0513) BP: (100-120)/(57-66) 111/65 mmHg (07/22 0513) SpO2:  [90 %-97 %] 96 % (07/22 0808) Weight:  [57.5 kg (126 lb 12.2 oz)] 57.5 kg (126 lb 12.2 oz) (07/21 2359) 3L [place]  INTAKE / OUTPUT: Intake/Output     07/21 0701 - 07/22 0700 07/22 0701 - 07/23 0700   P.O. 240    IV Piggyback 100    Total Intake(mL/kg) 340 (5.9)    Net +340          Urine Occurrence 1 x      PHYSICAL EXAMINATION: General: chronically ill appearing female with increased WOB Neuro: A/Ox4, pleasant affect, MAEs HEENT: 3L [place] Cardiovascular: Regular, no murmurs Lungs: B/l crackles throughout Abdomen: soft, non-distended, non-tender Musculoskeletal: no edema Skin: intact, no cyanosis or clubbing  LABS:  CBC  Recent Labs Lab 02/10/14 1652 02/11/14 0405  WBC 5.2 6.6  HGB 13.4 11.9*  HCT 39.3 35.0*  PLT 221 207   Coag's  Recent Labs Lab 02/10/14 1652 02/11/14 0405  INR 2.45* 2.61*   BMET  Recent Labs Lab 02/10/14 1652 02/11/14 0405  NA 138 141  K 4.3 4.0  CL 98 102  CO2 23 27  BUN 20 24*  CREATININE 1.28* 1.32*  GLUCOSE 178* 110*   Electrolytes  Recent Labs Lab 02/10/14 1652 02/11/14 0405  CALCIUM 10.1 9.6   Cardiac Enzymes  Recent Labs Lab 02/10/14 1653  PROBNP 437.0*   Imaging Dg Chest 2 View  02/10/2014   CLINICAL DATA:  Mid chest pain with tightness on inspiration for 1 day. History of pulmonary embolism and breast cancer.  EXAM: CHEST  2 VIEW  COMPARISON:  Radiographs 02/10/2014.  CT 07/10/2011.  FINDINGS: The heart size and mediastinal contours are stable without apparent adenopathy. The lungs are hyperinflated. There is diffuse prominence of the bronchovascular markings which is progressive compared with prior radiographs. Scattered nodularity is grossly stable. There is no  dominant lung mass or confluent airspace opacity. There is no significant pleural effusion. Postsurgical changes are present within the left breast. The bones appear unremarkable.  IMPRESSION: Progressive diffuse prominence of the bronchovascular markings compared with prior chest radiographs. This could indicate mild edema superimposed on emphysema. Underlying nodularity is grossly stable.   Electronically Signed   By: Roxy Horseman M.D.   On: 02/10/2014 18:50   Ct Angio Chest Pe W/cm &/or Wo Cm  02/10/2014   CLINICAL DATA:  Central chest pain for 1 day complaining of shortness of breath, pain with deep inspiration  EXAM: CT ANGIOGRAPHY CHEST WITH CONTRAST  TECHNIQUE: Multidetector CT imaging of the chest was performed using the standard protocol during bolus administration of intravenous contrast. Multiplanar CT image reconstructions and MIPs were obtained to evaluate the vascular anatomy.  CONTRAST:  OMNIPAQUE IOHEXOL 350 MG/ML SOLN  COMPARISON:  02/10/2014 radiograph, 10/26/2012 CT abdomen pelvis, 07/10/2011 CT thorax  FINDINGS: There is no filling defects in the pulmonary arterial system. There is no evidence of thoracic aortic dissection or dilatation. There is no pleural or pericardial effusion.  Thoracic inlet is normal. There are several mediastinal lymph nodes. These are increased in size when compared to the prior study of 07/10/2011, with the largest in the sub- carinal region measuring 18 mm. There is also increased lymphoid tissue with numerous small lymph nodes in both hila, represent again increased in number and conspicuity when compared to the prior study of 07/10/2011.  There is an 8 mm pulmonary nodule associated with the fissure on the right, at the level of the hilum. This is stable from 2012. Further inferiorly, there is an 8 mm pulmonary nodule that is pleural based laterally in the right lower lobe. This is also stable from 2012. Finally, there is a 6 mm pulmonary nodule in the  extreme inferior right lung base that is stable from 2012 as well. Given the stability, these are felt to be benign.  There is moderately severe diffuse bilateral patchy ground-glass attenuation. This is new from prior studies.  There are no acute musculoskeletal findings. Scans through the upper abdomen are unremarkable.  Review of the MIP images confirms the above findings.  IMPRESSION: Moderately severe diffuse bilateral ground-glass parenchymal attenuation. This is new from 2012, and, as best as can be determined comparing to the portions of the lung bases scanned in 2014, new from that study as well. Differential diagnostic possibilities include opportunistic infection such as with CMV and HSV, eosinophilic pneumonia, and hypersensitivity pneumonitis. Other possible causes of ground-glass attenuation including adenocarcinoma and pulmonary edema appear less likely on the current exam.  Mediastinal and hilar adenopathy noted, possibly reactive.   Electronically Signed   By: Esperanza Heir M.D.   On: 02/10/2014 22:04   CXR 7/21: mild pulm edema  Assessment:  Acute hypoxic respiratory failure, secondary to CAP - most likely viral PNA vs pulmonary involvement from RA H/o PE - long term coumadin, INR has been therapeutic  Pleuritic chest pain  Plan: - adjust oxygen to keep  SaO2 90 to 95% - Will need evaluation of oxygenation while walking, may need home oxygen - Day 1/x Ceftriaxone and Azithromycin - f/u respiratory viral panel 7/22 - f/u CBC with differential - f/u ANA, RF, anti CCP, ESR from 7/22 - Add solumedrol 40 mg q6h on 7/22 >> should help with pleuritic chest pain also - hold MTX, enebral - will d/c ganciclovir 7/22 - f/u CXR intermittently  - Discontinue Ganciclovir as CMV and HSV infections less likely - Continue long term coumadin therapy per primary team - defer bronchoscopy for now >> would consider bronch if oxygenation requirements increase    Thank you for the interesting  consult.   Lalla Brothers ACNP student     Pulmonary and [place] Pager: [phone]  02/11/2014, 9:19 AM  Reviewed above, examined, and documentation changes made as needed.  73 yo female presents with fever, dry cough, myalgias, sore throat, headache likely from viral respiratory infection.  She has hx of RA on chronic prednisone, enbral, and MTX.  She has developed pleuritic chest pain with progressive dyspnea and hypoxia.  She has crackles on respiratory exam.  CT chest shows diffuse, b/l GGO.  She most likely has acute viral PNA and possible has developed BOOP.  She could also have pulmonary involvement from her RA or toxicity from RA meds >> however, her symptoms and time course make these less likely.  PCP is a consideration, but seems less likely also.  Will start on high dose solumedrol.  Okay to continue with Abx for now >> likely can d/c soon if she has significant improvement after starting solumedrol.  Agree with holding MTX and enbral.  Will d/c ganciclovir.  Adjust oxygen as needed, and f/u CXR intermittently.  Will check respiratory viral panel, ANA, RF, anti CCP, and ESR.  If she does not have improvement in next 24 to 48 hrs, then should need to have bronchoscopy.  Chesley Mires, MD [facility] Pulmonary/Critical Care 02/11/2014, 11:01 AM Pager:  [phone] After 3pm call: [phone]

## 2014-02-12 DIAGNOSIS — J984 Other disorders of lung: Secondary | ICD-10-CM

## 2014-02-12 DIAGNOSIS — R0902 Hypoxemia: Secondary | ICD-10-CM

## 2014-02-12 LAB — CBC WITH DIFFERENTIAL/PLATELET
BASOS ABS: 0 10*3/uL (ref 0.0–0.1)
Basophils Relative: 0 % (ref 0–1)
Eosinophils Absolute: 0 10*3/uL (ref 0.0–0.7)
Eosinophils Relative: 0 % (ref 0–5)
HEMATOCRIT: 36.5 % (ref 36.0–46.0)
HEMOGLOBIN: 12.4 g/dL (ref 12.0–15.0)
LYMPHS PCT: 21 % (ref 12–46)
Lymphs Abs: 0.8 10*3/uL (ref 0.7–4.0)
MCH: 32.7 pg (ref 26.0–34.0)
MCHC: 34 g/dL (ref 30.0–36.0)
MCV: 96.3 fL (ref 78.0–100.0)
MONOS PCT: 2 % — AB (ref 3–12)
Monocytes Absolute: 0.1 10*3/uL (ref 0.1–1.0)
NEUTROS ABS: 3.1 10*3/uL (ref 1.7–7.7)
Neutrophils Relative %: 77 % (ref 43–77)
Platelets: 222 10*3/uL (ref 150–400)
RBC: 3.79 MIL/uL — ABNORMAL LOW (ref 3.87–5.11)
RDW: 16.3 % — AB (ref 11.5–15.5)
WBC: 4.1 10*3/uL (ref 4.0–10.5)

## 2014-02-12 LAB — URINE CULTURE
COLONY COUNT: NO GROWTH
Culture: NO GROWTH

## 2014-02-12 LAB — PROTIME-INR
INR: 2.95 — ABNORMAL HIGH (ref 0.00–1.49)
Prothrombin Time: 30.7 seconds — ABNORMAL HIGH (ref 11.6–15.2)

## 2014-02-12 LAB — BASIC METABOLIC PANEL
Anion gap: 13 (ref 5–15)
BUN: 23 mg/dL (ref 6–23)
CHLORIDE: 103 meq/L (ref 96–112)
CO2: 25 mEq/L (ref 19–32)
Calcium: 9.4 mg/dL (ref 8.4–10.5)
Creatinine, Ser: 1.08 mg/dL (ref 0.50–1.10)
GFR calc Af Amer: 58 mL/min — ABNORMAL LOW (ref >90)
GFR calc non Af Amer: 50 mL/min — ABNORMAL LOW (ref >90)
GLUCOSE: 211 mg/dL — AB (ref 70–99)
Potassium: 4.3 mEq/L (ref 3.7–5.3)
Sodium: 141 mEq/L (ref 137–147)

## 2014-02-12 LAB — CYCLIC CITRUL PEPTIDE ANTIBODY, IGG: Cyclic Citrullin Peptide Ab: 122.7 U/mL — ABNORMAL HIGH (ref 0.0–5.0)

## 2014-02-12 LAB — ANTI-NUCLEAR AB-TITER (ANA TITER): ANA Titer 1: 1:80 {titer} — ABNORMAL HIGH

## 2014-02-12 LAB — TSH: TSH: 0.635 u[IU]/mL (ref 0.350–4.500)

## 2014-02-12 LAB — SEDIMENTATION RATE: SED RATE: 39 mm/h — AB (ref 0–22)

## 2014-02-12 LAB — ANA: Anti Nuclear Antibody(ANA): POSITIVE — AB

## 2014-02-12 NOTE — Progress Notes (Signed)
PULMONARY / CRITICAL CARE MEDICINE   Name: Cathy Leonard MRN: [id number] DOB: [birth year]    ADMISSION DATE:  02/10/2014 CONSULTATION DATE: 02/11/2014  REFERRING MD : Tyrell Antonio  CHIEF COMPLAINT: Shortness of breath  BRIEF DESCRIPTION:  73 yo female with fever, chills, sweats, cough, wheeze, sore throat, headache, pleuritic chest pain for 1 week.  Found to have hypoxia, b/l GGO on CT chest.  Has hx of RA on MTX (restarted 5 weeks prior to admission), enbral, and prednisone.  She has hx of Breast cancer and pulmonary nodules.  STUDIES:  7/21 CT chest >> 8 mm nodule at level of Rt hilum, 8 mm nodule RLL, 6 mm nodule Rt lung base >> no changes since 2012; diffuse, b/l GGO 7/22 Labs >> RF 21, ESR 39  SIGNIFICANT EVENTS: 7/21 Admit 7/22 Start high dose steroids  SUBJECTIVE:  Still has dry cough and some chest discomfort.  Had sweats overnight.  Not needing as much oxygen.  VITAL SIGNS: Temp:  [97.5 F (36.4 C)-99.6 F (37.6 C)] 97.5 F (36.4 C) (07/23 0619) Pulse Rate:  [74-92] 74 (07/23 0619) Resp:  [18-20] 18 (07/23 0619) BP: (99-102)/(48-61) 102/61 mmHg (07/23 0619) SpO2:  [89 %-97 %] 97 % (07/23 0619) 3L [place]  INTAKE / OUTPUT: Intake/Output     07/22 0701 - 07/23 0700 07/23 0701 - 07/24 0700   P.O. 120    IV Piggyback 300    Total Intake(mL/kg) 420 (7.3)    Net +420          Urine Occurrence 2 x      PHYSICAL EXAMINATION: General: no distress Neuro: normal strength HEENT: no sinus tenderness Cardiovascular: Regular, no murmurs Lungs: improved air movement, not as much crackles, no wheeze Abdomen: soft, non tender Musculoskeletal: no edema Skin: no rashes  LABS: CBC Recent Labs     02/10/14  1652  02/11/14  0405  02/12/14  0440  WBC  5.2  6.6  4.1  HGB  13.4  11.9*  12.4  HCT  39.3  35.0*  36.5  PLT  221  207  222    Coag's Recent Labs     02/10/14  1652  02/11/14  0405  02/12/14  0440  INR  2.45*  2.61*  2.95*    BMET Recent Labs     02/10/14   1652  02/11/14  0405  02/12/14  0440  NA  138  141  141  K  4.3  4.0  4.3  CL  98  102  103  CO2  _0 BUN  20  24*  23  CREATININE  1.28*  1.32*  1.08  GLUCOSE  178*  110*  211*    Electrolytes Recent Labs     02/10/14  1652  02/11/14  0405  02/12/14  0440  CALCIUM  10.1  9.6  9.4    Cardiac Enzymes Recent Labs     02/10/14  1653  PROBNP  437.0*   Imaging Dg Chest 2 View  02/10/2014   CLINICAL DATA:  Mid chest pain with tightness on inspiration for 1 day. History of pulmonary embolism and breast cancer.  EXAM: CHEST  2 VIEW  COMPARISON:  Radiographs 02/10/2014.  CT 07/10/2011.  FINDINGS: The heart size and mediastinal contours are stable without apparent adenopathy. The lungs are hyperinflated. There is diffuse prominence of the bronchovascular markings which is progressive compared with prior radiographs. Scattered nodularity is grossly stable. There is no dominant  lung mass or confluent airspace opacity. There is no significant pleural effusion. Postsurgical changes are present within the left breast. The bones appear unremarkable.  IMPRESSION: Progressive diffuse prominence of the bronchovascular markings compared with prior chest radiographs. This could indicate mild edema superimposed on emphysema. Underlying nodularity is grossly stable.   Electronically Signed   By: Camie Patience M.D.   On: 02/10/2014 18:50   Ct Angio Chest Pe W/cm &/or Wo Cm  02/10/2014   CLINICAL DATA:  Central chest pain for 1 day complaining of shortness of breath, pain with deep inspiration  EXAM: CT ANGIOGRAPHY CHEST WITH CONTRAST  TECHNIQUE: Multidetector CT imaging of the chest was performed using the standard protocol during bolus administration of intravenous contrast. Multiplanar CT image reconstructions and MIPs were obtained to evaluate the vascular anatomy.  CONTRAST:  147mL OMNIPAQUE IOHEXOL 350 MG/ML SOLN  COMPARISON:  02/10/2014 radiograph, 10/26/2012 CT abdomen pelvis, 07/10/2011 CT  thorax  FINDINGS: There is no filling defects in the pulmonary arterial system. There is no evidence of thoracic aortic dissection or dilatation. There is no pleural or pericardial effusion.  Thoracic inlet is normal. There are several mediastinal lymph nodes. These are increased in size when compared to the prior study of 07/10/2011, with the largest in the sub- carinal region measuring 18 mm. There is also increased lymphoid tissue with numerous small lymph nodes in both hila, represent again increased in number and conspicuity when compared to the prior study of 07/10/2011.  There is an 8 mm pulmonary nodule associated with the fissure on the right, at the level of the hilum. This is stable from 2012. Further inferiorly, there is an 8 mm pulmonary nodule that is pleural based laterally in the right lower lobe. This is also stable from 2012. Finally, there is a 6 mm pulmonary nodule in the extreme inferior right lung base that is stable from 2012 as well. Given the stability, these are felt to be benign.  There is moderately severe diffuse bilateral patchy ground-glass attenuation. This is new from prior studies.  There are no acute musculoskeletal findings. Scans through the upper abdomen are unremarkable.  Review of the MIP images confirms the above findings.  IMPRESSION: Moderately severe diffuse bilateral ground-glass parenchymal attenuation. This is new from 2012, and, as best as can be determined comparing to the portions of the lung bases scanned in 2014, new from that study as well. Differential diagnostic possibilities include opportunistic infection such as with CMV and HSV, eosinophilic pneumonia, and hypersensitivity pneumonitis. Other possible causes of ground-glass attenuation including adenocarcinoma and pulmonary edema appear less likely on the current exam.  Mediastinal and hilar adenopathy noted, possibly reactive.   Electronically Signed   By: Skipper Cliche M.D.   On: 02/10/2014 22:04     IMPRESSION: 73 yo female with acute hypoxic respiratory failure with b/l GGO  on CT chest, and pleuritic chest pain.  She has hx of RA.  Her symptom description is suggestive of viral respiratory infection.  Differential includes viral PNA with post-viral BOOP, ILD from RA, pulmonary toxicity from RA medications, and PCP while on immunosuppressing meds for RA.  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  She has improvement in oxygen needs and on exam after starting high dose steroids 7/22.  A: Acute hypoxic respiratory failure with b/l GGO. P: Adjust oxygen to keep SaO2 90 to 95% F/u CXR 7/24 Continue solumedrol at 40 mg q6h >> if CXR better, then could transition  to 60 mg prednisone on 7/24 Hold MTX, enbral for now F/u ANA, anti CCP from 7/22 F/u respiratory viral panel from 7/22 Day 2/x Ceftriaxone and Azithromycin >> likely can d/c soon if CXR significantly better on 7/24 Will need bronchoscopy if no improvement   A: Pulmonary nodules >> stable on CT chest since 2012 >> benign. P: No follow up needed for these  A: Hx of PE. P: Anti-coagulation per primary team  Chesley Mires, MD [place] 02/12/2014, 9:05 AM Pager:  [phone] After 3pm call: [phone]

## 2014-02-12 NOTE — Progress Notes (Addendum)
ANTICOAGULATION CONSULT NOTE - Follow-up Consult  Pharmacy Consult for warfarin Indication: PE 2009  No Known Allergies  Patient Measurements: Height: 5\' 4"  (162.6 cm) Weight: 126 lb 12.2 oz (57.5 kg) IBW/kg (Calculated) : 54.7 Heparin Dosing Weight:   Vital Signs: Temp: 97.5 F (36.4 C) (07/23 0619) Temp src: Oral (07/23 0619) BP: 102/61 mmHg (07/23 0619) Pulse Rate: 74 (07/23 0619)  Labs:  Recent Labs  02/10/14 1652 02/11/14 0405 02/12/14 0440  HGB 13.4 11.9* 12.4  HCT 39.3 35.0* 36.5  PLT 221 207 222  LABPROT 26.6* 27.9* 30.7*  INR 2.45* 2.61* 2.95*  CREATININE 1.28* 1.32* 1.08    Estimated Creatinine Clearance: 40.7 ml/min (by C-G formula based on Cr of 1.08).  Assessment: 56 YOF presents withSOB.  On warfarin for h/o PE in '09.  INR therapeutic at admission.  Home dose: 5mg  daily except 2.5mg  MonThur (LD taken at home on 7/20)  Today's INR = 2.95  CBC: Hgb WNL, pltc WNL  Diet: regular  NO bleeding noted  On steroids and antibiotics that may effect warfarin/INR  Goal of Therapy:  INR 2-3  Plan:   Warfarin 2.5mg  po x tonight as per current orders, watch INR trend closely  Daily INR  No indication to hold warfarin as no plans for Bronchoscopy at this time   Doreene Eland, PharmD, BCPS.   Pager: [phone]  02/12/2014,8:40 AM

## 2014-02-12 NOTE — Progress Notes (Signed)
[facility] PROGRESS NOTE  Cathy Leonard DJM:[id number] DOB: 10-16-1940 DOA: 02/10/2014 PCP: Cari Caraway, MD  Assessment/Plan: 1-Acute hypoxic Respiratory Failure: CTA with ground glass changes. She has history of RA. She is on prednisone and Embrel.  Continue with Ceftriaxone and Azithromycin to cover for infectious process day 2.  Ganciclovir discontinue 7-22 by pulmonary  Appreciate Dr Halford Chessman help. Follow chest x ray on 7-24. Depending on chest x ray results will change to  prednisone on 7-24.    2-History of PE;  On coumadin.   3-RA; Hold Enbrel. Continue with prednisone. Hold methotrexate.   4-History of mitral valve prolapse: on propanolol.    Code Status: Full Code.  Family Communication: Care discussed with patient.  Disposition Plan: Remain inpatient.    Consultants:  Pulmonary  Procedures:  none  Antibiotics:  Ceftriaxone 7-22  Azithromycin 7-22  Ganciclovir 7-22    HPI/Subjective: Feeling better, had nights sweat last night.     Objective: Filed Vitals:   02/12/14 0619  BP: 102/61  Pulse: 74  Temp: 97.5 F (36.4 C)  Resp: 18    Intake/Output Summary (Last 24 hours) at 02/12/14 1349 Last data filed at 02/11/14 2300  Gross per 24 hour  Intake    120 ml  Output      0 ml  Net    120 ml   Filed Weights   02/10/14 2359  Weight: 57.5 kg (126 lb 12.2 oz)    Exam:   General:  Alert, in no distress.   Cardiovascular: S 1, S 2 RRR  Respiratory: Bilateral crackles worse bases.   Abdomen: BS present, soft, NT  Musculoskeletal: no edema.   Data Reviewed: Basic Metabolic Panel:  Recent Labs Lab 02/10/14 1652 02/11/14 0405 02/12/14 0440  NA 138 141 141  K 4.3 4.0 4.3  CL 98 102 103  CO2 23 27 25   GLUCOSE 178* 110* 211*  BUN 20 24* 23  CREATININE 1.28* 1.32* 1.08  CALCIUM 10.1 9.6 9.4   Liver Function Tests: No results found for this basename: AST, ALT, ALKPHOS, BILITOT, PROT, ALBUMIN,  in the last 168 hours No results  found for this basename: LIPASE, AMYLASE,  in the last 168 hours No results found for this basename: AMMONIA,  in the last 168 hours CBC:  Recent Labs Lab 02/10/14 1652 02/11/14 0405 02/12/14 0440  WBC 5.2 6.6 4.1  NEUTROABS  --   --  3.1  HGB 13.4 11.9* 12.4  HCT 39.3 35.0* 36.5  MCV 98.3 96.2 96.3  PLT 221 207 222   Cardiac Enzymes: No results found for this basename: CKTOTAL, CKMB, CKMBINDEX, TROPONINI,  in the last 168 hours BNP (last 3 results)  Recent Labs  02/10/14 1653  PROBNP 437.0*   CBG: No results found for this basename: GLUCAP,  in the last 168 hours  Recent Results (from the past 240 hour(s))  URINE CULTURE     Status: None   Collection Time    02/10/14 11:11 PM      Result Value Ref Range Status   Specimen Description URINE, CLEAN CATCH   Final   Special Requests NONE   Final   Culture  Setup Time     Final   Value: 02/11/2014 04:09     Performed at [facility]     Final   Value: NO GROWTH     Performed at [facility]   Culture     Final   Value:  NO GROWTH     Performed at [facility]   Report Status 02/12/2014 FINAL   Final     Studies: Dg Chest 2 View  02/10/2014   CLINICAL DATA:  Mid chest pain with tightness on inspiration for 1 day. History of pulmonary embolism and breast cancer.  EXAM: CHEST  2 VIEW  COMPARISON:  Radiographs 02/10/2014.  CT 07/10/2011.  FINDINGS: The heart size and mediastinal contours are stable without apparent adenopathy. The lungs are hyperinflated. There is diffuse prominence of the bronchovascular markings which is progressive compared with prior radiographs. Scattered nodularity is grossly stable. There is no dominant lung mass or confluent airspace opacity. There is no significant pleural effusion. Postsurgical changes are present within the left breast. The bones appear unremarkable.  IMPRESSION: Progressive diffuse prominence of the bronchovascular markings compared with prior  chest radiographs. This could indicate mild edema superimposed on emphysema. Underlying nodularity is grossly stable.   Electronically Signed   By: Camie Patience M.D.   On: 02/10/2014 18:50   Ct Angio Chest Pe W/cm &/or Wo Cm  02/10/2014   CLINICAL DATA:  Central chest pain for 1 day complaining of shortness of breath, pain with deep inspiration  EXAM: CT ANGIOGRAPHY CHEST WITH CONTRAST  TECHNIQUE: Multidetector CT imaging of the chest was performed using the standard protocol during bolus administration of intravenous contrast. Multiplanar CT image reconstructions and MIPs were obtained to evaluate the vascular anatomy.  CONTRAST:  165mL OMNIPAQUE IOHEXOL 350 MG/ML SOLN  COMPARISON:  02/10/2014 radiograph, 10/26/2012 CT abdomen pelvis, 07/10/2011 CT thorax  FINDINGS: There is no filling defects in the pulmonary arterial system. There is no evidence of thoracic aortic dissection or dilatation. There is no pleural or pericardial effusion.  Thoracic inlet is normal. There are several mediastinal lymph nodes. These are increased in size when compared to the prior study of 07/10/2011, with the largest in the sub- carinal region measuring 18 mm. There is also increased lymphoid tissue with numerous small lymph nodes in both hila, represent again increased in number and conspicuity when compared to the prior study of 07/10/2011.  There is an 8 mm pulmonary nodule associated with the fissure on the right, at the level of the hilum. This is stable from 2012. Further inferiorly, there is an 8 mm pulmonary nodule that is pleural based laterally in the right lower lobe. This is also stable from 2012. Finally, there is a 6 mm pulmonary nodule in the extreme inferior right lung base that is stable from 2012 as well. Given the stability, these are felt to be benign.  There is moderately severe diffuse bilateral patchy ground-glass attenuation. This is new from prior studies.  There are no acute musculoskeletal findings. Scans  through the upper abdomen are unremarkable.  Review of the MIP images confirms the above findings.  IMPRESSION: Moderately severe diffuse bilateral ground-glass parenchymal attenuation. This is new from 2012, and, as best as can be determined comparing to the portions of the lung bases scanned in 2014, new from that study as well. Differential diagnostic possibilities include opportunistic infection such as with CMV and HSV, eosinophilic pneumonia, and hypersensitivity pneumonitis. Other possible causes of ground-glass attenuation including adenocarcinoma and pulmonary edema appear less likely on the current exam.  Mediastinal and hilar adenopathy noted, possibly reactive.   Electronically Signed   By: Skipper Cliche M.D.   On: 02/10/2014 22:04    Scheduled Meds: . azithromycin  500 mg Intravenous Q24H  . B-complex with vitamin C  1 tablet Oral Daily  . calcium-vitamin D  1 tablet Oral Daily  . cefTRIAXone (ROCEPHIN)  IV  1 g Intravenous Q24H  . digoxin  0.125 mg Oral Daily  . folic acid  326 mcg Oral BID  . methylPREDNISolone (SOLU-MEDROL) injection  40 mg Intravenous Q6H  . multivitamin  1 tablet Oral Daily  . propranolol  20 mg Oral BID  . warfarin  2.5 mg Oral Once per day on Mon Thu  . warfarin  5 mg Oral Once per day on Sun Tue Wed Fri Sat  . Warfarin - Pharmacist Dosing Inpatient   Does not apply q1800   Continuous Infusions:   Active Problems:   Anxiety state, unspecified   Long term current use of anticoagulant   Rheumatoid arthritis(714.0)   Pulmonary infection   Immunocompromised state   Respiratory failure   Viral pneumonitis   Acute respiratory failure with hypoxia   Pulmonary infiltrates   Viral pneumonia    Time spent: 35 minutes.     Niel Hummer A  [facility] Pager [phone]. If 7PM-7AM, please contact night-coverage at [email or web address], password [facility] 02/12/2014, 1:49 PM  LOS: 2 days

## 2014-02-13 ENCOUNTER — Inpatient Hospital Stay (HOSPITAL_COMMUNITY): Payer: Medicare HMO

## 2014-02-13 LAB — RESPIRATORY VIRUS PANEL
ADENOVIRUS: NOT DETECTED
INFLUENZA A H1: NOT DETECTED
Influenza A H3: NOT DETECTED
Influenza A: NOT DETECTED
Influenza B: NOT DETECTED
Metapneumovirus: NOT DETECTED
PARAINFLUENZA 3 A: NOT DETECTED
Parainfluenza 1: NOT DETECTED
Parainfluenza 2: NOT DETECTED
Respiratory Syncytial Virus A: NOT DETECTED
Respiratory Syncytial Virus B: NOT DETECTED
Rhinovirus: NOT DETECTED

## 2014-02-13 LAB — PROTIME-INR
INR: 4.48 — ABNORMAL HIGH (ref 0.00–1.49)
PROTHROMBIN TIME: 42.6 s — AB (ref 11.6–15.2)

## 2014-02-13 MED ORDER — WARFARIN SODIUM 5 MG PO TABS: ORAL_TABLET | ORAL | Status: DC

## 2014-02-13 MED ORDER — PREDNISONE 20 MG PO TABS: 40.0000 mg | ORAL_TABLET | Freq: Every day | ORAL | Status: DC

## 2014-02-13 MED ORDER — PREDNISONE 20 MG PO TABS
40.0000 mg | ORAL_TABLET | Freq: Every day | ORAL | Status: DC
  Administered 2014-02-13: 40 mg via ORAL
  Filled 2014-02-13 (×2): qty 2

## 2014-02-13 NOTE — Discharge Summary (Signed)
Physician Discharge Summary  Cathy Leonard XAJ:[id number] DOB: Jun 18, 1941 DOA: 02/10/2014  PCP: Cari Caraway, MD  Admit date: 02/10/2014 Discharge date: 02/13/2014  Time spent: 35 minutes  Recommendations for Outpatient Follow-up:  1. Follow up viral respiratory panel.  2. Needs to follow up with pulmonary, for further titration of prednisone. Consideration of resuming FA medications.  3. Needs to follow up with Rheumatology.  4. Adjust coumadin as needed.   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    Anxiety state, unspecified   Long term current use of anticoagulant   Rheumatoid arthritis(714.0)   Immunocompromised state       Discharge Condition: stable.   Diet recommendation: Heart Healthy  Filed Weights   02/10/14 2359  Weight: 57.5 kg (126 lb 12.2 oz)    History of present illness:  Pt is a 73 yr old woman with known RA for which she takes Embrel and prednisone. She states that she started not feeling well last Friday with primarily extreme fatigue. She visited her PCP who placed her on antibiotics for a UTI. Over the weekend she developed worsening shortness of breath, cough, and wheezes. She has had fevers and chills. She is found to be significantly hypoxic in the ED.   Hospital Course:  1-Acute hypoxic Respiratory Failure: Differential viral PNA vs ILD due to RA vs BOOP, vs pulmonary toxicity from RA medications. CTA with ground glass changes. She has history of RA. She is on low dose  prednisone and Embrel.  She received 3 days of Ceftriaxone and Azithromycin.  Ganciclovir discontinue 7-22 by pulmonary  Appreciate Dr Halford Chessman help. Follow chest x ray on 7-24 show only mild infra hilar infiltrates  She improved on IV solumedrol. Respiratory pane is pending.  She will be discharge on 40 mg of prednisone.  RF positive, ANA positive, Cyclic citrullin 094.7. Will defer to her Rheumatologist  ordering Anti-double strand DNA.  Methotrexate and Enbrel was discontinue, and will continue to hold this medications until she follow with pulmonary and Rheumatology.    2-History of PE; On coumadin. INR elevated, probably related to antibiotics. I have advised patient not to take coumadin today 7-24. She will follow up PCP clinic 7-25 for INR check. Coumadin dose will need to be adjusted.   3-RA; Hold Enbrel. Continue with prednisone. Hold methotrexate.  4-History of mitral valve prolapse: on propanolol.    Procedures:  none  Consultations:  Pulmonary  Discharge Exam: Filed Vitals:   02/13/14 0529  BP: 116/65  Pulse: 68  Temp: 98 F (36.7 C)  Resp: 18    General: no distress.  Cardiovascular: S 1, S 2 RRR Respiratory: CTA  Discharge Instructions You were cared for by a hospitalist during your hospital stay. If you have any questions about your discharge medications or the care you received while you were in the hospital after you are discharged, you can call the unit and asked to speak with the hospitalist on call if the hospitalist that took care of you is not available. Once you are discharged, your primary care physician will handle any further medical issues. Please note that NO REFILLS for any discharge medications will be authorized once you are discharged, as it is imperative that you return to your primary care physician (or establish a relationship with a primary care physician if you do not have one) for your aftercare needs so that they can reassess your need for  medications and monitor your lab values.  Discharge Instructions   Diet - low sodium heart healthy    Complete by:  As directed      Increase activity slowly    Complete by:  As directed             Medication List    STOP taking these medications       amoxicillin 875 MG tablet  Commonly known as:  AMOXIL     ENBREL 50 MG/ML injection  Generic drug:  etanercept     methotrexate 2.5 MG  tablet  Commonly known as:  RHEUMATREX      TAKE these medications       ALPRAZolam 0.5 MG tablet  Commonly known as:  XANAX  Take 0.5 mg by mouth every 6 (six) hours as needed for anxiety or sleep.     B-complex with vitamin C tablet  Take 1 tablet by mouth daily.     Biotin 2500 MCG Caps  Take 5,000 mcg by mouth daily.     CALCIUM + D PO  Take 600 mg by mouth daily.     digoxin 0.125 MG tablet  Commonly known as:  LANOXIN  Take 1 tablet (0.125 mg total) by mouth daily.     EYE-VITES Tabs  Take by mouth daily.     folic acid 578 MCG tablet  Commonly known as:  FOLVITE  Take 400 mcg by mouth 2 (two) times daily.     FORTEO 600 MCG/2.4ML Soln  Generic drug:  Teriparatide (Recombinant)  Inject 20 mcg into the skin daily.     predniSONE 20 MG tablet  Commonly known as:  DELTASONE  Take 2 tablets (40 mg total) by mouth daily with breakfast.     propranolol 20 MG tablet  Commonly known as:  INDERAL  Take 1 tablet (20 mg total) by mouth 2 (two) times daily.     TYLENOL ARTHRITIS PAIN 650 MG CR tablet  Generic drug:  acetaminophen  Take 650 mg by mouth as needed for pain.     VITAMIN D PO  Take 1,000 Units by mouth 2 (two) times daily.     warfarin 5 MG tablet  Commonly known as:  COUMADIN  Do not take coumadin today and you need INR check on 7-25.       No Known Allergies     Follow-up Information   Follow up with PARRETT,TAMMY, NP On 02/19/2014. (10:30 AM)    Specialty:  Nurse Practitioner   Contact information:   [address] [place] [address] [phone]       Follow up with [facility], MD. (you need coumadin level check on 7-25)    Specialty:  Family Medicine   Contact information:   [address] [place] [address] [phone]        The results of significant diagnostics from this hospitalization (including imaging, microbiology, ancillary and laboratory) are listed below for reference.    Significant Diagnostic  Studies: Dg Chest 2 View  02/13/2014   CLINICAL DATA:  Follow-up ground-glass infiltrates  EXAM: CHEST  2 VIEW  COMPARISON:  02/10/2014  FINDINGS: Cardiomediastinal silhouette is stable. Stable mild infrahilar interstitial prominence without focal consolidation. No pulmonary edema. Mild hyperinflation. No segmental infiltrates.  IMPRESSION: Mild infrahilar interstitial prominence without focal consolidation. No segmental infiltrate. No pulmonary edema. Mild hyperinflation.   Electronically Signed   By: Lahoma Crocker M.D.   On: 02/13/2014 08:31   Dg Chest 2  View  02/10/2014   CLINICAL DATA:  Mid chest pain with tightness on inspiration for 1 day. History of pulmonary embolism and breast cancer.  EXAM: CHEST  2 VIEW  COMPARISON:  Radiographs 02/10/2014.  CT 07/10/2011.  FINDINGS: The heart size and mediastinal contours are stable without apparent adenopathy. The lungs are hyperinflated. There is diffuse prominence of the bronchovascular markings which is progressive compared with prior radiographs. Scattered nodularity is grossly stable. There is no dominant lung mass or confluent airspace opacity. There is no significant pleural effusion. Postsurgical changes are present within the left breast. The bones appear unremarkable.  IMPRESSION: Progressive diffuse prominence of the bronchovascular markings compared with prior chest radiographs. This could indicate mild edema superimposed on emphysema. Underlying nodularity is grossly stable.   Electronically Signed   By: Camie Patience M.D.   On: 02/10/2014 18:50   Ct Angio Chest Pe W/cm &/or Wo Cm  02/10/2014   CLINICAL DATA:  Central chest pain for 1 day complaining of shortness of breath, pain with deep inspiration  EXAM: CT ANGIOGRAPHY CHEST WITH CONTRAST  TECHNIQUE: Multidetector CT imaging of the chest was performed using the standard protocol during bolus administration of intravenous contrast. Multiplanar CT image reconstructions and MIPs were obtained to  evaluate the vascular anatomy.  CONTRAST:  189mL OMNIPAQUE IOHEXOL 350 MG/ML SOLN  COMPARISON:  02/10/2014 radiograph, 10/26/2012 CT abdomen pelvis, 07/10/2011 CT thorax  FINDINGS: There is no filling defects in the pulmonary arterial system. There is no evidence of thoracic aortic dissection or dilatation. There is no pleural or pericardial effusion.  Thoracic inlet is normal. There are several mediastinal lymph nodes. These are increased in size when compared to the prior study of 07/10/2011, with the largest in the sub- carinal region measuring 18 mm. There is also increased lymphoid tissue with numerous small lymph nodes in both hila, represent again increased in number and conspicuity when compared to the prior study of 07/10/2011.  There is an 8 mm pulmonary nodule associated with the fissure on the right, at the level of the hilum. This is stable from 2012. Further inferiorly, there is an 8 mm pulmonary nodule that is pleural based laterally in the right lower lobe. This is also stable from 2012. Finally, there is a 6 mm pulmonary nodule in the extreme inferior right lung base that is stable from 2012 as well. Given the stability, these are felt to be benign.  There is moderately severe diffuse bilateral patchy ground-glass attenuation. This is new from prior studies.  There are no acute musculoskeletal findings. Scans through the upper abdomen are unremarkable.  Review of the MIP images confirms the above findings.  IMPRESSION: Moderately severe diffuse bilateral ground-glass parenchymal attenuation. This is new from 2012, and, as best as can be determined comparing to the portions of the lung bases scanned in 2014, new from that study as well. Differential diagnostic possibilities include opportunistic infection such as with CMV and HSV, eosinophilic pneumonia, and hypersensitivity pneumonitis. Other possible causes of ground-glass attenuation including adenocarcinoma and pulmonary edema appear less likely  on the current exam.  Mediastinal and hilar adenopathy noted, possibly reactive.   Electronically Signed   By: Skipper Cliche M.D.   On: 02/10/2014 22:04    Microbiology: Recent Results (from the past 240 hour(s))  URINE CULTURE     Status: None   Collection Time    02/10/14 11:11 PM      Result Value Ref Range Status   Specimen Description URINE, CLEAN  CATCH   Final   Special Requests NONE   Final   Culture  Setup Time     Final   Value: 02/11/2014 04:09     Performed at [place]     Final   Value: NO GROWTH     Performed at [facility]   Culture     Final   Value: NO GROWTH     Performed at [facility]   Report Status 02/12/2014 FINAL   Final     Labs: Basic Metabolic Panel:  Recent Labs Lab 02/10/14 1652 02/11/14 0405 02/12/14 0440  NA 138 141 141  K 4.3 4.0 4.3  CL 98 102 103  CO2 23 27 25   GLUCOSE 178* 110* 211*  BUN 20 24* 23  CREATININE 1.28* 1.32* 1.08  CALCIUM 10.1 9.6 9.4   Liver Function Tests: No results found for this basename: AST, ALT, ALKPHOS, BILITOT, PROT, ALBUMIN,  in the last 168 hours No results found for this basename: LIPASE, AMYLASE,  in the last 168 hours No results found for this basename: AMMONIA,  in the last 168 hours CBC:  Recent Labs Lab 02/10/14 1652 02/11/14 0405 02/12/14 0440  WBC 5.2 6.6 4.1  NEUTROABS  --   --  3.1  HGB 13.4 11.9* 12.4  HCT 39.3 35.0* 36.5  MCV 98.3 96.2 96.3  PLT 221 207 222   Cardiac Enzymes: No results found for this basename: CKTOTAL, CKMB, CKMBINDEX, TROPONINI,  in the last 168 hours BNP: BNP (last 3 results)  Recent Labs  02/10/14 1653  PROBNP 437.0*   CBG: No results found for this basename: GLUCAP,  in the last 168 hours     Signed:  Niel Hummer A  [facility] 02/13/2014, 11:32 AM

## 2014-02-13 NOTE — Progress Notes (Signed)
ANTICOAGULATION CONSULT NOTE - Follow-up Consult  Pharmacy Consult for warfarin Indication: PE 2009  No Known Allergies  Patient Measurements: Height: 5\' 4"  (162.6 cm) Weight: 126 lb 12.2 oz (57.5 kg) IBW/kg (Calculated) : 54.7 Heparin Dosing Weight:   Vital Signs: Temp: 98 F (36.7 C) (07/24 0529) Temp src: Oral (07/24 0529) BP: 116/65 mmHg (07/24 0529) Pulse Rate: 68 (07/24 0529)  Labs:  Recent Labs  02/10/14 1652 02/11/14 0405 02/12/14 0440 02/13/14 0410  HGB 13.4 11.9* 12.4  --   HCT 39.3 35.0* 36.5  --   PLT 221 207 222  --   LABPROT 26.6* 27.9* 30.7* 42.6*  INR 2.45* 2.61* 2.95* 4.48*  CREATININE 1.28* 1.32* 1.08  --     Estimated Creatinine Clearance: 40.7 ml/min (by C-G formula based on Cr of 1.08).  Assessment: 75 YOF presents withSOB.  On warfarin for h/o PE in '09.  INR therapeutic at admission.  Home dose: 5mg  daily except 2.5mg  MonThur (LD taken at home on 7/20)  Today's INR = 4.48  CBC: Hgb WNL, pltc WNL 7/23  Diet: regular  No bleeding noted  On steroids and antibiotics that may effect warfarin/INR  Goal of Therapy:  INR 2-3  Plan:   Hold warfarin tonight due to SUPRAtherapeutic INR  Monitor for signs/symptoms of bleeding  Daily INR  Kizzie Furnish, PharmD Pager: [phone] 02/13/2014 7:34 AM

## 2014-02-13 NOTE — Progress Notes (Signed)
PULMONARY / CRITICAL CARE MEDICINE   Name: Cathy Leonard MRN: [id number] DOB: Sep 26, 1940    ADMISSION DATE:  02/10/2014 CONSULTATION DATE: 02/11/2014  REFERRING MD : Tyrell Antonio  CHIEF COMPLAINT: Shortness of breath  BRIEF DESCRIPTION:  73 yo female with fever, chills, sweats, cough, wheeze, sore throat, headache, pleuritic chest pain for 1 week.  Found to have hypoxia, b/l GGO on CT chest.  Has hx of RA on MTX (restarted 5 weeks prior to admission), enbral, and prednisone.  She has hx of Breast cancer and pulmonary nodules.  STUDIES:  7/21 CT chest >> 8 mm nodule at level of Rt hilum, 8 mm nodule RLL, 6 mm nodule Rt lung base >> no changes since 2012; diffuse, b/l GGO 7/22 Labs >> RF 21, anti CCP 122.7, ESR 39, ANA positive 1:80  SIGNIFICANT EVENTS: 7/21 Admit 7/22 Start high dose steroids 7/24 Change to prednisone  SUBJECTIVE:  Feels much better.  No cough or chest pain.  Off supplemental oxygen.  VITAL SIGNS: Temp:  [97.9 F (36.6 C)-98.2 F (36.8 C)] 98 F (36.7 C) (07/24 0529) Pulse Rate:  [68-88] 68 (07/24 0529) Resp:  [18] 18 (07/24 0529) BP: (97-116)/(47-65) 116/65 mmHg (07/24 0529) SpO2:  [95 %-97 %] 97 % (07/24 0529) Room air  INTAKE / OUTPUT: Intake/Output     07/23 0701 - 07/24 0700 07/24 0701 - 07/25 0700   P.O.     IV Piggyback     Total Intake(mL/kg)     Net              PHYSICAL EXAMINATION: General: no distress Neuro: normal strength HEENT: no sinus tenderness Cardiovascular: Regular, no murmurs Lungs: no wheeze/rales Abdomen: soft, non tender Musculoskeletal: no edema Skin: no rashes  LABS: CBC Recent Labs     02/10/14  1652  02/11/14  0405  02/12/14  0440  WBC  5.2  6.6  4.1  HGB  13.4  11.9*  12.4  HCT  39.3  35.0*  36.5  PLT  221  207  222    Coag's Recent Labs     02/11/14  0405  02/12/14  0440  02/13/14  0410  INR  2.61*  2.95*  4.48*    BMET Recent Labs     02/10/14  1652  02/11/14  0405  02/12/14  0440  NA  138   141  141  K  4.3  4.0  4.3  CL  98  102  103  CO2  _0 BUN  20  24*  23  CREATININE  1.28*  1.32*  1.08  GLUCOSE  178*  110*  211*    Electrolytes Recent Labs     02/10/14  1652  02/11/14  0405  02/12/14  0440  CALCIUM  10.1  9.6  9.4    Cardiac Enzymes Recent Labs     02/10/14  1653  PROBNP  437.0*   Imaging Dg Chest 2 View  02/13/2014   CLINICAL DATA:  Follow-up ground-glass infiltrates  EXAM: CHEST  2 VIEW  COMPARISON:  02/10/2014  FINDINGS: Cardiomediastinal silhouette is stable. Stable mild infrahilar interstitial prominence without focal consolidation. No pulmonary edema. Mild hyperinflation. No segmental infiltrates.  IMPRESSION: Mild infrahilar interstitial prominence without focal consolidation. No segmental infiltrate. No pulmonary edema. Mild hyperinflation.   Electronically Signed   By: Lahoma Crocker M.D.   On: 02/13/2014 08:31    IMPRESSION: 73 yo female with acute hypoxic respiratory failure with  b/l GGO  on CT chest, and pleuritic chest pain.  She has hx of RA, and she has increased ESR and anti CCP.  Her symptom description is suggestive of viral respiratory infection.  She has dramatic improvement with high dose steroids.  Differential includes viral PNA with post-viral BOOP, ILD from RA, pulmonary toxicity from RA medications.  She has improvement in oxygen needs and on exam after starting high dose steroids 7/22.  A: Acute hypoxic respiratory failure with b/l GGO. P: Change to prednisone 40 mg per day on 7/24 >> continue this until re-assesses as outpt Hold MTX, enbral for now >> she will need f/u with [facility] as outpt to re-assess F/u respiratory viral panel from 7/22 Bacterial infection unlikely >> will d/c Abx 7/24  A: Pulmonary nodules >> stable on CT chest since 2012 >> benign. P: No follow up needed for these  A: Hx of PE. P: Anti-coagulation per primary team  D/w Dr. Tyrell Antonio.  Okay for d/c home from pulmonary stand point.  I have  scheduled her for follow up with Tammy Parrett in pulmonary office on 02/19/14 at 14 am.   Chesley Mires, MD [place] 02/13/2014, 10:51 AM Pager:  [phone] After 3pm call: [phone]

## 2014-02-13 NOTE — Progress Notes (Signed)
Discharge instructions reviewed with patient, questions answered, verbalized understanding. Patient given 40 mg of Prednisone prior to discharge and voiced understanding that she is not to take any coumadin today.  Prescriptions for Prednisone and Coumadin given to patient.  Patient ambulatory to main entrance of hospital accompanied by RN.  Patient driving herself home.  Patient in good condition at time of discharge from [facility].

## 2014-02-19 ENCOUNTER — Telehealth: Payer: Self-pay | Admitting: *Deleted

## 2014-02-19 ENCOUNTER — Ambulatory Visit (INDEPENDENT_AMBULATORY_CARE_PROVIDER_SITE_OTHER): Payer: Commercial Managed Care - HMO | Admitting: Adult Health

## 2014-02-19 ENCOUNTER — Encounter: Payer: Self-pay | Admitting: Adult Health

## 2014-02-19 ENCOUNTER — Ambulatory Visit (INDEPENDENT_AMBULATORY_CARE_PROVIDER_SITE_OTHER)
Admission: RE | Admit: 2014-02-19 | Discharge: 2014-02-19 | Disposition: A | Discharge status: Home / Self Care | Payer: Commercial Managed Care - HMO | Source: Ambulatory Visit | Attending: Adult Health | Admitting: Adult Health

## 2014-02-19 VITALS — BP 108/56 | HR 61 | Temp 98.3°F | Ht 65.0 in | Wt 124.4 lb

## 2014-02-19 DIAGNOSIS — R918 Other nonspecific abnormal finding of lung field: Secondary | ICD-10-CM

## 2014-02-19 DIAGNOSIS — J129 Viral pneumonia, unspecified: Secondary | ICD-10-CM

## 2014-02-19 DIAGNOSIS — J9601 Acute respiratory failure with hypoxia: Secondary | ICD-10-CM

## 2014-02-19 DIAGNOSIS — J9600 Acute respiratory failure, unspecified whether with hypoxia or hypercapnia: Secondary | ICD-10-CM

## 2014-02-19 MED ORDER — PREDNISONE 10 MG PO TABS: ORAL_TABLET | ORAL | Status: DC

## 2014-02-19 NOTE — Progress Notes (Signed)
Reviewed and agree with assessment/plan. 

## 2014-02-19 NOTE — Telephone Encounter (Signed)
Pt returned call.  Appt scheduled w/ VS on 8.21.15 at 2pm.  Pt okay with appt date/time and is aware to contact the office for sooner follow up if needed for worsening symptoms.  Nothing further needed; will sign off.

## 2014-02-19 NOTE — Assessment & Plan Note (Signed)
Bilateral groundglass opacities -questionable etiology in the setting of the rheumatoid arthritis, previously on Enbrel and Methotrexate Questionable interstitial lung disease secondary to rheumatoid arthritis versus methotrexate pulmonary toxicity Chest x-ray without significant change Patient will need followup with rheumatology We'll repeat chest x-ray in 3 weeks. On return We'll consider repeat CT and if no significant change. Consider possible lung biopsy, along with full pulmonary function testing  Plan  Continue on Prednisone 40mg  daily for 10 days then 30mg  daily for 10 days then hold at 20mg   daily until seen back in office .  Follow up with Dr. Charlestine Night in 1-2 weeks and As needed   Follow up with Dr. Halford Chessman in 3 weeks and As needed   Please contact office for sooner follow up if symptoms do not improve or worsen or seek emergency care

## 2014-02-19 NOTE — Telephone Encounter (Signed)
LMOM TCB x1 for pt to schedule 3 week follow up with VS >> okay for double-book per Ashtyn.

## 2014-02-19 NOTE — Patient Instructions (Signed)
Continue on Prednisone 40mg  daily for 10 days then 30mg  daily for 10 days then hold at 20mg   daily until seen back in office .  Follow up with Dr. Charlestine Night in 1-2 weeks and As needed   Follow up with Dr. Halford Chessman in 3 weeks and As needed   Please contact office for sooner follow up if symptoms do not improve or worsen or seek emergency care

## 2014-02-19 NOTE — Assessment & Plan Note (Signed)
Resolved

## 2014-02-19 NOTE — Progress Notes (Signed)
Subjective:    Patient ID: Cathy Leonard, female    DOB: Jun 20, 1941, 73 y.o.   MRN: [id number]  HPI 73 yo seen for initial PCCM consult   02/19/2014 [facility] follow up  Pt returns for a post hospital follow up .  She was admitted 7/21-7/24/15 for acute hypoxic respiratory failure with ground glass of opacities on CT scan. Patient presented to the emergency room with a one-week history of extreme fatigue, shortness, of breath, cough, wheezing, and fevers, and chills. She was found to be significantly hypoxic. Patient has a history of pulmonary embolism on chronic Coumadin therapy, rheumatoid arthritis on Enbrel and methotrexate, and prednisone. She had held her methotrexate few weeks prior to admission. Patient was started on aggressive IV antibiotics , along with pulmonary hygiene, and oxygen therapy. She was also given ganciclovir for possible underlying CMV infection in immunosuppressed patient. Pulmonary critical care was consulted. Patient was found to have a possible viral pneumonia versus interstitial lung disease. Due to rib, right, is versus bruit versus pulmonary toxicity from, rheumatoid arthritis medications, especially, methotrexate. Methotrexate was discontinued. Her Enbrel was held.  Viral panel was negative. Sed. Rate was minimally elevated at 39. ANA/RA factor were positive Patient was started on IV steroids, and transitioned over at prednisone 40 mg daily. At discharge Prior to discharge. Patient's oxygenation improved with no need for outpatient oxygen therapy. Today in the office. O2 saturations are 96% on room air. Immature walk without desaturations noted. Chest x-ray today shows persistent bibasilar airspace process , without significant change.  She says that her shortness, of breath is slightly better. However, she continues to fatigue quite easily. She denies any hemoptysis, orthopnea, PND, or increased leg swelling. She denies any fevers. Does still have some nasal  congestion, and intermittent sore throat.,   Review of Systems Constitutional:   No  weight loss, night sweats,  Fevers, chills, fatigue, or  lassitude.  HEENT:   No headaches,  Difficulty swallowing,  Tooth/dental problems, or  Sore throat,                No sneezing, itching, ear ache,  +nasal congestion, post nasal drip,   CV:  No chest pain,  Orthopnea, PND, swelling in lower extremities, anasarca, dizziness, palpitations, syncope.   GI  No heartburn, indigestion, abdominal pain, nausea, vomiting, diarrhea, change in bowel habits, loss of appetite, bloody stools.   Resp:    No chest wall deformity  Skin: no rash or lesions.  GU: no dysuria, change in color of urine, no urgency or frequency.  No flank pain, no hematuria   MS:  No joint pain or swelling.  No decreased range of motion.  No back pain.  Psych:  No change in mood or affect. No depression or anxiety.  No memory loss.         Objective:   Physical Exam GEN: A/Ox3; pleasant , NAD, well nourished   HEENT:  [place]/AT,  EACs-clear, TMs-wnl, NOSE-clear, THROAT-clear, no lesions, no postnasal drip or exudate noted.   NECK:  Supple w/ fair ROM; no JVD; normal carotid impulses w/o bruits; no thyromegaly or nodules palpated; no lymphadenopathy.  RESP  Bibasilar crackles noted,   No accessory muscle use, no dullness to percussion  CARD:  RRR, no m/r/g  , no peripheral edema, pulses intact, no cyanosis or clubbing.  GI:   Soft & nt; nml bowel sounds; no organomegaly or masses detected.  Musco: Warm bil, arthritic changes along hands  Neuro: alert, no focal deficits noted.    Skin: Warm, no lesions or rashes         Assessment & Plan:

## 2014-03-13 ENCOUNTER — Ambulatory Visit (INDEPENDENT_AMBULATORY_CARE_PROVIDER_SITE_OTHER): Payer: Commercial Managed Care - HMO | Admitting: Pulmonary Disease

## 2014-03-13 ENCOUNTER — Encounter: Payer: Self-pay | Admitting: Pulmonary Disease

## 2014-03-13 ENCOUNTER — Ambulatory Visit (INDEPENDENT_AMBULATORY_CARE_PROVIDER_SITE_OTHER)
Admission: RE | Admit: 2014-03-13 | Discharge: 2014-03-13 | Disposition: A | Discharge status: Home / Self Care | Payer: Commercial Managed Care - HMO | Source: Ambulatory Visit | Attending: Pulmonary Disease | Admitting: Pulmonary Disease

## 2014-03-13 VITALS — BP 122/68 | HR 71 | Ht 64.0 in | Wt 127.0 lb

## 2014-03-13 DIAGNOSIS — J9600 Acute respiratory failure, unspecified whether with hypoxia or hypercapnia: Secondary | ICD-10-CM

## 2014-03-13 DIAGNOSIS — M0510 Rheumatoid lung disease with rheumatoid arthritis of unspecified site: Secondary | ICD-10-CM

## 2014-03-13 DIAGNOSIS — R918 Other nonspecific abnormal finding of lung field: Secondary | ICD-10-CM

## 2014-03-13 DIAGNOSIS — M069 Rheumatoid arthritis, unspecified: Secondary | ICD-10-CM

## 2014-03-13 DIAGNOSIS — J9601 Acute respiratory failure with hypoxia: Secondary | ICD-10-CM

## 2014-03-13 NOTE — Patient Instructions (Signed)
Chest xray today >> will call with results, and then discuss plan for prednisone dose Will schedule breathing test (PFT) Follow up in 1 month

## 2014-03-13 NOTE — Progress Notes (Signed)
Chief Complaint  Patient presents with  . Follow-up    No complaints. Pt currently taking $RemoveBeforeDE'40mg'wLPlFzWNOHLEUpF$  as directed. Pt wanting to discuss long term goal of treatment.    CC: Dr. Charlestine Night  History of Present Illness: Cathy Leonard is a 73 y.o. female with steroid responsive b/l GGO pulmonary infiltrates on CT chest in July 2015.  She has hx of RA, and PE.  She has been doing better compared to her symptoms when she was in hospital.  She remains of 20 mg of prednisone >> she was told by her rheumatologist that she should wait to hear about pulmonary plan before resuming other agents for her rheumatoid arthritis.    She denies cough, wheeze, sputum hemoptysis, fever, dyspnea, or chest pain.  Her joint pain feels better with prednisone.  She is not having gland or leg swelling.  TESTS: 02/10/14 CT chest >> 8 mm nodule at level of Rt hilum, 8 mm nodule RLL, 6 mm nodule Rt lung base >> no changes since 2012; diffuse, b/l GGO  02/11/14 Labs >> RF 21, anti CCP 122.7, ESR 39, ANA positive 1:80   Past medical hx, Past surgical hx, Medications, Allergies, Family hx, Social hx all reviewed.   Physical Exam:  General - No distress ENT - No sinus tenderness, no oral exudate, no LAN Cardiac - s1s2 regular, no murmur Chest - No wheeze/rales/dullness Back - No focal tenderness Abd - Soft, non-tender Ext - No edema Neuro - Normal strength Skin - No rashes Psych - normal mood, and behavior   Assessment/Plan:  Chesley Mires, MD [place] [facility]Critical [facility]/Sleep Pager:  [phone]

## 2014-03-15 NOTE — Assessment & Plan Note (Signed)
Resolved

## 2014-03-15 NOTE — Assessment & Plan Note (Signed)
Advised her to discuss with rheumatology about plans to resume RA medications once her prednisone dose is further decreased.  She was previously on enbrel and methotrexate.  It might be best to resume medications in stepwise fashion to determine how well she tolerates re-introduction of the medications.

## 2014-03-15 NOTE — Assessment & Plan Note (Signed)
She had steroid responsive pulmonary infiltrates from July 2015.  Differential again is viral pneumonia vs rheumatoid arthritis in her lungs vs medication toxicity.  She has tremendous improvement clinically.  Will repeat chest xray and call her with results >> will then discuss plans to further taper dose of prednisone.  Will also arrange for PFT's to further assess her respiratory status.  Will then decide if she needs repeat CT chest.

## 2014-03-17 ENCOUNTER — Telehealth: Payer: Self-pay | Admitting: Pulmonary Disease

## 2014-03-17 NOTE — Telephone Encounter (Signed)
Dg Chest 2 View  03/13/2014   CLINICAL DATA:  Followup lung infection, hypertension, past history breast cancer, pulmonary embolism  EXAM: CHEST  2 VIEW  COMPARISON:  02/19/2014  FINDINGS: Upper normal heart size.  Normal mediastinal contours and pulmonary vascularity.  Emphysematous and bronchitic changes consistent with COPD.  Improved RIGHT basilar infiltrate.  Chronic accentuation of interstitial markings at mid to lower lungs stable only mildly increased since 07/20/2010 exam.  No new areas of consolidation, pleural effusion or pneumothorax.  Bones demineralized.  IMPRESSION: COPD changes.  No acute abnormalities.   Electronically Signed   By: Lavonia Dana M.D.   On: 03/13/2014 15:30    Will have my nurse inform pt that CXR looks better compared to when she was in hospital.  Please have her change prednisone to 15 mg daily for 1 week, then 10 mg daily for 1 week, then 5 mg daily until her next ROV.  Please ensure that she has enough prednisone to continue this course of medication.  Will call her back once PFT reviewed.

## 2014-03-18 MED ORDER — PREDNISONE 5 MG PO TABS: 5.0000 mg | ORAL_TABLET | ORAL | Status: DC

## 2014-03-18 NOTE — Telephone Encounter (Signed)
Results have been explained to patient, pt expressed understanding.  Pt aware that Prednisone 5mg  tabs have been sent to [facility] per her request Pt also scheduled for PFT same day as follow up appt with VS 04/16/14 Nothing further needed.

## 2014-04-08 ENCOUNTER — Other Ambulatory Visit: Payer: Self-pay | Admitting: Cardiology

## 2014-04-09 ENCOUNTER — Other Ambulatory Visit: Payer: Self-pay | Admitting: Cardiology

## 2014-04-10 ENCOUNTER — Ambulatory Visit: Payer: Commercial Managed Care - HMO | Admitting: Pulmonary Disease

## 2014-04-13 ENCOUNTER — Ambulatory Visit: Payer: Commercial Managed Care - HMO | Admitting: Cardiology

## 2014-04-13 ENCOUNTER — Ambulatory Visit (INDEPENDENT_AMBULATORY_CARE_PROVIDER_SITE_OTHER): Payer: Commercial Managed Care - HMO | Admitting: Cardiology

## 2014-04-13 VITALS — BP 106/68 | HR 75 | Ht 65.0 in | Wt 129.0 lb

## 2014-04-13 DIAGNOSIS — M069 Rheumatoid arthritis, unspecified: Secondary | ICD-10-CM

## 2014-04-13 DIAGNOSIS — I059 Rheumatic mitral valve disease, unspecified: Secondary | ICD-10-CM

## 2014-04-13 DIAGNOSIS — R002 Palpitations: Secondary | ICD-10-CM

## 2014-04-13 NOTE — Assessment & Plan Note (Signed)
Patient has not had any recent flareup of her palpitations.   she remains on propranolol 20 mg twice a day

## 2014-04-13 NOTE — Patient Instructions (Signed)
Your physician recommends that you continue on your current medications as directed. Please refer to the Current Medication list given to you today.  Your physician wants you to follow-up in: 6 MONTH OV  You will receive a reminder letter in the mail two months in advance. If you don't receive a letter, please call our office to schedule the follow-up appointment.  

## 2014-04-13 NOTE — Progress Notes (Signed)
Cathy Leonard Date of Birth:  Mar 04, 1941 Cathy Leonard [address] [place], [place]  [address] [phone]        Fax   [phone]   History of Present Illness: This pleasant 73 year old woman is seen for a scheduled 6 month followup office visit. She has a history of having had a pulmonary embolus in 2009. She does not have any history of ischemic heart disease. She had a normal Myoview treadmill stress test in 2008. She has been on long-term Coumadin since her pulmonary embolism. She has a history of hypercoagulability. She does not have any history of known atrial fibrillation. Recently she has been experiencing increased palpitations. She wore a heart monitor for 30 days in March and April 2013. It showed normal sinus rhythm with occasional interpolated PVCs and no significant or dangerous arrhythmias. She does try to limit her caffeine intake.. She has a history of hypercholesterolemia. This is being monitored by her primary care physician.  She was admitted to [facility] from July 21 until 02/13/2014 for hypoxemia and shortness of breath.  She was diagnosed with probable viral pneumonia.  She was also treated with IV steroids.  On admission she had a CT angiogram of the chest which showed no recurrent pulmonary emboli.  She was taken off her Enbrel during that hospitalization and has not yet resumed Enbrel. She remains on long-term warfarin which is monitored at Dr. Ellard Artis McNeil's office with [facility].  Current Outpatient Prescriptions  Medication Sig Dispense Refill  . ALPRAZolam (XANAX) 0.5 MG tablet Take 0.5 mg by mouth every 6 (six) hours as needed for anxiety or sleep.      . B Complex-C (B-COMPLEX WITH VITAMIN C) tablet Take 1 tablet by mouth daily.      . Calcium Carbonate-Vitamin D (CALCIUM + D PO) Take 600 mg by mouth daily.       . Cholecalciferol (VITAMIN D PO) Take 1,000 Units by mouth 2 (two) times daily.       . digoxin (LANOXIN) 0.125 MG  tablet Take 1 tablet (0.125 mg total) by mouth daily.  90 tablet  3  . folic acid (FOLVITE) 364 MCG tablet Take 400 mcg by mouth 2 (two) times daily.      . Multiple Vitamins-Minerals (EYE-VITES) TABS Take by mouth daily.        . predniSONE (DELTASONE) 10 MG tablet Take 5 mg by mouth daily. 5mg  daily as taper as directed      . predniSONE (DELTASONE) 5 MG tablet Take 1 tablet (5 mg total) by mouth as directed.  100 tablet  0  . propranolol (INDERAL) 20 MG tablet TAKE 1 TABLET TWICE DAILY  180 tablet  0  . warfarin (COUMADIN) 5 MG tablet Take 1/2 tablet Tuesday, Thursday, and Saturday. Take 1 tablet all other days.      Marland Kitchen etanercept (ENBREL) 50 MG/ML injection Inject 50 mg into the skin once a week. **ON HOLD**      . Teriparatide, Recombinant, (FORTEO) 600 MCG/2.4ML SOLN Inject 20 mcg into the skin daily.       No current facility-administered medications for this visit.    Allergies  Allergen Reactions  . Rocephin [Ceftriaxone Sodium In Dextrose]     REACTION: face became very flushed    Patient Active Problem List   Diagnosis Date Noted  . Long term current use of anticoagulant 10/31/2010    Priority: High  . Palpitations 12/06/2010    Priority:  Medium  . Rheumatoid arthritis(714.0) 12/06/2010    Priority: Medium  . Pulmonary infiltrates 02/11/2014  . Immunocompromised state 02/10/2014  . Respiratory failure 02/10/2014  . Abdominal pain 03/07/2011  . Vitiligo 12/06/2010  . Pulmonary embolism 10/31/2010  . GENERALIZED OSTEOARTHROSIS INVOLVING HAND 12/10/2007  . BACK PAIN, ACUTE 11/08/2007  . HAND PAIN 11/08/2007  . Anxiety state, unspecified 10/10/2007  . PERSONAL HISTORY OF MALIGNANT NEOPLASM OF BREAST 10/10/2007  . MITRAL VALVE PROLAPSE 09/20/2006  . ROSACEA 09/20/2006    History  Smoking status  . Never Smoker   Smokeless tobacco  . Not on file    History  Alcohol Use No    Family History  Problem Relation Age of Onset  . Breast cancer Mother 100  . Breast  cancer Sister 41    Review of Systems: Constitutional: no fever chills diaphoresis or fatigue or change in weight.  Head and neck: no hearing loss, no epistaxis, no photophobia or visual disturbance. Respiratory: No cough, shortness of breath or wheezing. Cardiovascular: No chest pain peripheral edema, palpitations. Gastrointestinal: No abdominal distention, no abdominal pain, no change in bowel habits hematochezia or melena. Genitourinary: No dysuria, no frequency, no urgency, no nocturia. Musculoskeletal:No arthralgias, no back pain, no gait disturbance or myalgias. Neurological: No dizziness, no headaches, no numbness, no seizures, no syncope, no weakness, no tremors. Hematologic: No lymphadenopathy, no easy bruising. Psychiatric: No confusion, no hallucinations, no sleep disturbance.    Physical Exam: Filed Vitals:   04/13/14 1202  BP: 106/68  Pulse: 75  The patient appears to be in no distress.  Head and neck exam reveals that the pupils are equal and reactive.  The extraocular movements are full.  There is no scleral icterus.  Mouth and pharynx are benign.  No lymphadenopathy.  No carotid bruits.  The jugular venous pressure is normal.  Thyroid is not enlarged or tender.  Chest is clear to percussion and auscultation.  No rales or rhonchi.  Expansion of the chest is symmetrical.  Heart reveals no abnormal lift or heave.  First and second heart sounds are normal.  There is no murmur gallop rub or click.  The abdomen is soft and nontender.  Bowel sounds are normoactive.  There is no hepatosplenomegaly or mass.  There are no abdominal bruits.  Extremities reveal no phlebitis or edema.  Pedal pulses are good.  There is no cyanosis or clubbing.  Neurologic exam is normal strength and no lateralizing weakness.  No sensory deficits.  Integument reveals no rash  EKG shows normal sinus rhythm and nonspecific ST-T wave changes.  Since last tracing 02/10/14, heart rate is  slower.  Assessment / Plan: 1. Palpitations 2. mitral valve prolapse 3.  Rheumatoid arthritis 4. recent probable viral pneumonitis requiring hospitalization 5. hypercoagulable state, on long-term Coumadin 6. osteoporosis with prior spontaneous compression fractures  Disposition: Continue same medication.  Recheck in 6 months for office visit

## 2014-04-13 NOTE — Assessment & Plan Note (Signed)
She has not been aware of any recent palpitations tachycardia or chest pain

## 2014-04-14 ENCOUNTER — Encounter: Payer: Self-pay | Admitting: Cardiology

## 2014-04-16 ENCOUNTER — Encounter: Payer: Self-pay | Admitting: Pulmonary Disease

## 2014-04-16 ENCOUNTER — Ambulatory Visit (INDEPENDENT_AMBULATORY_CARE_PROVIDER_SITE_OTHER): Payer: Commercial Managed Care - HMO | Admitting: Pulmonary Disease

## 2014-04-16 VITALS — BP 122/80 | HR 76 | Ht 63.5 in | Wt 131.0 lb

## 2014-04-16 DIAGNOSIS — R918 Other nonspecific abnormal finding of lung field: Secondary | ICD-10-CM

## 2014-04-16 DIAGNOSIS — M0510 Rheumatoid lung disease with rheumatoid arthritis of unspecified site: Secondary | ICD-10-CM

## 2014-04-16 LAB — PULMONARY FUNCTION TEST
DL/VA % PRED: 92 %
DL/VA: 4.39 ml/min/mmHg/L
DLCO UNC % PRED: 67 %
DLCO unc: 15.85 ml/min/mmHg
FEF 25-75 POST: 2.17 L/s
FEF 25-75 PRE: 1.99 L/s
FEF2575-%Change-Post: 9 %
FEF2575-%PRED-POST: 124 %
FEF2575-%Pred-Pre: 114 %
FEV1-%Change-Post: 1 %
FEV1-%PRED-POST: 88 %
FEV1-%Pred-Pre: 87 %
FEV1-PRE: 1.87 L
FEV1-Post: 1.9 L
FEV1FVC-%Change-Post: 1 %
FEV1FVC-%PRED-PRE: 108 %
FEV6-%Change-Post: 0 %
FEV6-%PRED-POST: 84 %
FEV6-%Pred-Pre: 84 %
FEV6-POST: 2.29 L
FEV6-Pre: 2.28 L
FEV6FVC-%CHANGE-POST: 0 %
FEV6FVC-%Pred-Post: 105 %
FEV6FVC-%Pred-Pre: 104 %
FVC-%CHANGE-POST: 0 %
FVC-%Pred-Post: 80 %
FVC-%Pred-Pre: 80 %
FVC-POST: 2.29 L
FVC-Pre: 2.29 L
POST FEV1/FVC RATIO: 83 %
Post FEV6/FVC ratio: 100 %
Pre FEV1/FVC ratio: 82 %
Pre FEV6/FVC Ratio: 100 %
RV % pred: 78 %
RV: 1.74 L
TLC % pred: 81 %
TLC: 4.05 L

## 2014-04-16 NOTE — Patient Instructions (Signed)
Will schedule CT chest and call with results Continue prednisone 5 mg daily until you speak with Dr. Charlestine Night Follow up in 3 months

## 2014-04-16 NOTE — Assessment & Plan Note (Signed)
She had steroid responsive pulmonary infiltrates from July 2015.  Differential again is viral pneumonia vs rheumatoid arthritis in her lungs vs medication toxicity.  She had clinically improvement with prednisone therapy.  She however continues to have subtle symptoms of dyspnea on exertion, has mild diffusion defect on PFT, and inspiratory squeaks on chest exam.    I will have her continue 5 mg prednisone for now, and arrange for f/u CT chest with high resolution images.  If she has persistent changes, will then need to work in conjunction with rheumatology to determine optimal therapy for her rheumatoid arthritis.

## 2014-04-16 NOTE — Progress Notes (Signed)
Chief Complaint  Patient presents with  . Follow-up    Review PFT. Denies any issues with breathing since last OV    CC: Dr. Truslow  History of Present Illness: Cathy Leonard is a 73 y.o. female with steroid responsive b/l GGO pulmonary infiltrates on CT chest in July 2015.  She has hx of RA, and PE.  She remains on 5 mg prednisone.  She gets occasional cough in the morning after she wakes up >> she thinks this comes from her sinuses and possible allergies.  She gets winded when walking up stairs, but doesn't feel this is out of the ordinary.  She denies wheeze, chest pain, fever, or rash.  She is due to see Dr. Truslow in October.  She had PFT's earlier today.  This showed mild diffusion defect, but otherwise normal.  TESTS: 02/10/14 CT chest >> 8 mm nodule at level of Rt hilum, 8 mm nodule RLL, 6 mm nodule Rt lung base >> no changes since 2012; diffuse, b/l GGO  02/11/14 Labs >> RF 21, anti CCP 122.7, ESR 39, ANA positive 1:80 PFT 04/16/14 >> FEV1 1.90 (88%), FEV1% 83, TLC 4.05 (81%), DLCO 67%, no BD  Past medical hx, Past surgical hx, Medications, Allergies, Family hx, Social hx all reviewed.   Physical Exam:  General - No distress ENT - No sinus tenderness, no oral exudate, no LAN Cardiac - s1s2 regular, no murmur Chest - inspiratory squeaks b/l at base >> no improvement with coughing Back - No focal tenderness Abd - Soft, non-tender Ext - No edema Neuro - Normal strength Skin - No rashes Psych - normal mood, and behavior   Assessment/Plan:   , MD [place] [facility]/Critical Care/Sleep Pager:  [phone]      

## 2014-04-16 NOTE — Progress Notes (Signed)
PFT done today. 

## 2014-05-04 ENCOUNTER — Ambulatory Visit (INDEPENDENT_AMBULATORY_CARE_PROVIDER_SITE_OTHER)
Admission: RE | Admit: 2014-05-04 | Discharge: 2014-05-04 | Disposition: A | Discharge status: Home / Self Care | Payer: Commercial Managed Care - HMO | Source: Ambulatory Visit | Attending: Pulmonary Disease | Admitting: Pulmonary Disease

## 2014-05-04 DIAGNOSIS — M0510 Rheumatoid lung disease with rheumatoid arthritis of unspecified site: Secondary | ICD-10-CM

## 2014-05-09 ENCOUNTER — Telehealth: Payer: Self-pay | Admitting: Pulmonary Disease

## 2014-05-09 NOTE — Telephone Encounter (Signed)
CT chest 05/04/14 >> 7 mm RLL nodule/4 mm nodule RUL no change since 2012, peripheral GGO at lung bases b/l, mild traction BTX, moderate airtrapping   Left message with pt voicemail informing that she has mild changes of RA in her lungs.  She needs to f/u with rheumatologist to continue Tx of her RA.  Advise her to call back with questions.

## 2014-05-11 ENCOUNTER — Telehealth: Payer: Self-pay | Admitting: Pulmonary Disease

## 2014-05-11 NOTE — Telephone Encounter (Signed)
Dr Halford Chessman please advise on below regarding pt's diagnosis and prognosis.  Thank you.

## 2014-05-15 NOTE — Telephone Encounter (Signed)
CT chest 05/04/14 >> 7 mm RLL nodule/4 mm nodule RUL no change since 2012, peripheral GGO at lung bases b/l, mild traction BTX, moderate airtrapping   Discussed results with pt.  She has since been restarted on enbrel and prednisone d/c by rheumatology.  She is not having significant respiratory symptoms.  Will monitor clinically on current tx and f/u in office in 3 months from last visit.

## 2014-06-02 ENCOUNTER — Other Ambulatory Visit: Payer: Self-pay | Admitting: Cardiology

## 2014-06-02 DIAGNOSIS — F419 Anxiety disorder, unspecified: Secondary | ICD-10-CM

## 2014-06-10 ENCOUNTER — Other Ambulatory Visit: Payer: Self-pay | Admitting: Cardiology

## 2014-06-15 ENCOUNTER — Other Ambulatory Visit: Payer: Self-pay | Admitting: Cardiology

## 2014-06-16 ENCOUNTER — Ambulatory Visit: Payer: Commercial Managed Care - HMO | Admitting: Cardiology

## 2014-06-16 MED ORDER — PROPRANOLOL HCL 20 MG PO TABS: 20.0000 mg | ORAL_TABLET | Freq: Two times a day (BID) | ORAL | Status: DC

## 2014-08-05 ENCOUNTER — Ambulatory Visit (INDEPENDENT_AMBULATORY_CARE_PROVIDER_SITE_OTHER): Payer: Commercial Managed Care - HMO | Admitting: Pulmonary Disease

## 2014-08-05 ENCOUNTER — Encounter: Payer: Self-pay | Admitting: Pulmonary Disease

## 2014-08-05 VITALS — BP 118/76 | HR 72 | Ht 64.0 in | Wt 130.4 lb

## 2014-08-05 DIAGNOSIS — R918 Other nonspecific abnormal finding of lung field: Secondary | ICD-10-CM

## 2014-08-05 DIAGNOSIS — M0510 Rheumatoid lung disease with rheumatoid arthritis of unspecified site: Secondary | ICD-10-CM

## 2014-08-05 NOTE — Patient Instructions (Signed)
Will schedule CT chest for April 2016 Follow up in April 2016 after CT chest done

## 2014-08-05 NOTE — Progress Notes (Signed)
Chief Complaint  Patient presents with  . Follow-up    ILD    CC: Dr. Charlestine Night  History of Present Illness: Cathy Leonard is a 74 y.o. female with steroid responsive b/l GGO pulmonary infiltrates on CT chest in July 2015.  She has hx of RA.  She has been doing better compared to last visit.  She is on enbrel since October, and is now off prednisone.  She denies cough, wheeze, sputum, fever, chest pain, or hemoptysis.  She will get winded going up steps, but no problem on level ground.  TESTS: 02/10/14 CT chest >> 8 mm nodule at level of Rt hilum, 8 mm nodule RLL, 6 mm nodule Rt lung base >> no changes since 2012; diffuse, b/l GGO  02/11/14 Labs >> RF 21, anti CCP 122.7, ESR 39, ANA positive 1:80 PFT 04/16/14 >> FEV1 1.90 (88%), FEV1% 83, TLC 4.05 (81%), DLCO 67%, no BD CT chest 05/04/14 >> 7 mm RLL nodule/4 mm nodule RUL no change since 2012, peripheral GGO at lung bases b/l, mild traction BTX, moderate airtrapping   Past medical hx >> PE 2009, Breast cancer 2004, MVP, Macular degeneration  Past surgical hx, Medications, Allergies, Family hx, Social hx all reviewed.   Physical Exam: Blood pressure 118/76, pulse 72, height $RemoveBe'5\' 4"'VeAYXIemY$  (1.626 m), weight 130 lb 6.4 oz (59.149 kg), SpO2 96 %. Body mass index is 22.37 kg/(m^2).  General - No distress ENT - No sinus tenderness, no oral exudate, no LAN Cardiac - s1s2 regular, no murmur Chest - no wheeze/rales Back - No focal tenderness Abd - Soft, non-tender Ext - No edema Neuro - Normal strength Skin - No rashes Psych - normal mood, and behavior   Assessment/Plan:  Interstitial lung disease with mild GGO/BTX/pulmonary nodules in setting of rheumatoid arthritis. She has improved clinically and on exam compared to last visit. Plan: - she is to continue enbrel per rheumatology - she will need f/u CT chest w/o contrast in October 2016  Chesley Mires, MD [place] [facility]/Critical Care/Sleep Pager:  [phone]

## 2014-11-03 ENCOUNTER — Ambulatory Visit (INDEPENDENT_AMBULATORY_CARE_PROVIDER_SITE_OTHER)
Admission: RE | Admit: 2014-11-03 | Discharge: 2014-11-03 | Disposition: A | Discharge status: Home / Self Care | Payer: Commercial Managed Care - HMO | Source: Ambulatory Visit | Attending: Pulmonary Disease | Admitting: Pulmonary Disease

## 2014-11-03 ENCOUNTER — Ambulatory Visit: Payer: Commercial Managed Care - HMO | Admitting: Cardiology

## 2014-11-03 DIAGNOSIS — M0510 Rheumatoid lung disease with rheumatoid arthritis of unspecified site: Secondary | ICD-10-CM | POA: Diagnosis not present

## 2014-11-06 ENCOUNTER — Encounter: Payer: Self-pay | Admitting: Cardiology

## 2014-11-06 ENCOUNTER — Ambulatory Visit (INDEPENDENT_AMBULATORY_CARE_PROVIDER_SITE_OTHER): Payer: Commercial Managed Care - HMO | Admitting: Cardiology

## 2014-11-06 VITALS — BP 118/70 | HR 97 | Ht 64.0 in | Wt 125.1 lb

## 2014-11-06 DIAGNOSIS — I2510 Atherosclerotic heart disease of native coronary artery without angina pectoris: Secondary | ICD-10-CM | POA: Diagnosis not present

## 2014-11-06 DIAGNOSIS — R9431 Abnormal electrocardiogram [ECG] [EKG]: Secondary | ICD-10-CM

## 2014-11-06 DIAGNOSIS — R0609 Other forms of dyspnea: Secondary | ICD-10-CM

## 2014-11-06 NOTE — Progress Notes (Signed)
Cardiology Office Note   Date:  11/06/2014   ID:  Cathy Leonard, DOB 1940-11-19, MRN [id number]  PCP:  Cari Caraway, MD  Cardiologist:   Darlin Coco, MD   No chief complaint on file.     History of Present Illness: Cathy Leonard is a 74 y.o. female who presents for follow-up office visit  This pleasant 74 year old woman is seen for a scheduled 6 month followup office visit. She has a history of having had a pulmonary embolus in 2009. She does not have any history of ischemic heart disease. She had a normal Myoview treadmill stress test in 2008. She has been on long-term Coumadin since her pulmonary embolism. She has a history of hypercoagulability. She does not have any history of known atrial fibrillation.  She has a history of hypercholesterolemia. This is being monitored by her primary care physician.  She was admitted to [facility] from July 21 until 02/13/2014 for hypoxemia and shortness of breath. He was diagnosed with cryptogenic organizing pneumonia.  She is now being followed by Dr. Halford Chessman of the pulmonary service.  She was also treated with IV steroids. On admission she had a CT angiogram of the chest which showed no recurrent pulmonary emboli. She was taken off her Enbrel during that hospitalization and has not yet resumed Enbrel. She remains on long-term warfarin which is monitored at Dr. Ellard Artis McNeil's office with [facility]. She recently had a CT scan of the chest which showed a fight atherosclerotic plaque in the LAD and right coronary arteries.  She has had a past history of mild hypercholesterolemia.  This is followed by her PCP.  She has had some dyspnea which may be multifactorial.  She is concerned about her coronary artery calcification.  We'll have her return for a Lexiscan Myoview stress test.  Her EKG today is abnormal and shows nonspecific ST-T wave changes.  Past Medical History  Diagnosis Date  . Rheumatoid arthritis(714.0)   . Pulmonary embolism  2009  . Breast cancer 2004  . Osteoporosis   . Lung nodules     Benign  . Thrombocytopenia   . Macular degeneration   . Mitral valve prolapse   . Palpitations   . Diverticulitis     Past Surgical History  Procedure Laterality Date  . Breast lumpectomy  2003    Left, Chemotherapy/Radiation  . Nuclear sress test  2008    EF 67%, Normal  . US echocardiography  2010    EF 55-60%, Mild Aortic Sclerosis, MVP, mild MR     Current Outpatient Prescriptions  Medication Sig Dispense Refill  . ALPRAZolam (XANAX) 0.5 MG tablet Take 0.5 mg by mouth as needed for anxiety (for anxiety).    . B Complex-C (B-COMPLEX WITH VITAMIN C) tablet Take 1 tablet by mouth daily.    . Calcium Carbonate-Vitamin D (CALCIUM + D PO) Take 600 mg by mouth daily.     . Cholecalciferol (VITAMIN D PO) Take 1,000 Units by mouth 2 (two) times daily.     . digoxin (LANOXIN) 0.125 MG tablet Take 0.125 mg by mouth daily.    Marland Kitchen etanercept (ENBREL) 50 MG/ML injection Inject 50 mg into the skin once a week. **ON HOLD**    . folic acid (FOLVITE) 338 MCG tablet Take 400 mcg by mouth 2 (two) times daily.    . Multiple Vitamins-Minerals (EYE-VITES) TABS Take by mouth daily.      . propranolol (INDERAL) 20 MG tablet Take 1 tablet (20 mg  total) by mouth 2 (two) times daily. 180 tablet 1  . warfarin (COUMADIN) 5 MG tablet Take 1/2 tablet Tuesday, Thursday, and Saturday. Take 1 tablet all other days.     No current facility-administered medications for this visit.    Allergies:   Rocephin    Social History:  The patient  reports that she has never smoked. She does not have any smokeless tobacco history on file. She reports that she does not drink alcohol.   Family History:  The patient's family history includes Breast cancer (age of onset: 42) in her sister; Breast cancer (age of onset: 68) in her mother.    ROS:  Please see the history of present illness.   Otherwise, review of systems are positive for none.   All other  systems are reviewed and negative.    PHYSICAL EXAM: VS:  BP 118/70 mmHg  Pulse 97  Ht 5\' 4"  (1.626 m)  Wt 125 lb 1.9 oz (56.754 kg)  BMI 21.47 kg/m2  SpO2 93% , BMI Body mass index is 21.47 kg/(m^2). GEN: Well nourished, well developed, in no acute distress HEENT: normal Neck: no JVD, carotid bruits, or masses Cardiac: RRR; no murmurs, rubs, or gallops,no edema  Respiratory:  She has crackling rales at the bases suggestive of pulmonary fibrosis. GI: soft, nontender, nondistended, + BS MS: no deformity or atrophy Skin: warm and dry, no rash Neuro:  Strength and sensation are intact Psych: euthymic mood, full affect   EKG:  EKG is ordered today. The ekg ordered today demonstrates normal sinus rhythm.  Nonspecific ST-T wave changes.   Recent Labs: 02/10/2014: Pro B Natriuretic peptide (BNP) 437.0* 02/12/2014: BUN 23; Creatinine 1.08; Hemoglobin 12.4; Platelets 222; Potassium 4.3; Sodium 141; TSH 0.635    Lipid Panel No results found for: CHOL, TRIG, HDL, CHOLHDL, VLDL, LDLCALC, LDLDIRECT    Wt Readings from Last 3 Encounters:  11/06/14 125 lb 1.9 oz (56.754 kg)  08/05/14 130 lb 6.4 oz (59.149 kg)  04/16/14 131 lb (59.421 kg)       ASSESSMENT AND PLAN:  1. Palpitations 2. mitral valve prolapse 3. Rheumatoid arthritis 4.  History of cryptogenic organizing pneumonia in July 2015 5. hypercoagulable state, on long-term Coumadin 6. osteoporosis with prior spontaneous compression fractures 7.  Coronary artery disease with coronary artery calcification and plaque of the LAD and right coronary artery on CT scan.  Abnormal EKG.  Exertional dyspnea which is multifactorial.  Disposition: Continue same medication. Recheck in 6 months for office visit. Return for [facility] stress test.   Current medicines are reviewed at length with the patient today.  The patient does not have concerns regarding medicines.  The following changes have been made:  no change  Labs/  tests ordered today include:  Orders Placed This Encounter  Procedures  . Myocardial Perfusion Imaging  . EKG 12-Lead     Signed, Darlin Coco, MD  11/06/2014 6:21 PM    [facility] [address], [place], [place]  [address] Phone: [phone]; Fax: [phone]

## 2014-11-06 NOTE — Patient Instructions (Signed)
Medication Instructions:  Your physician recommends that you continue on your current medications as directed. Please refer to the Current Medication list given to you today.  Labwork: none  Testing/Procedures: Your physician has requested that you have a lexiscan myoview. For further information please visit [email or web address]. Please follow instruction sheet, as given.  Follow-Up: Your physician wants you to follow-up in: 6 month ov You will receive a reminder letter in the mail two months in advance. If you don't receive a letter, please call our office to schedule the follow-up appointment.

## 2014-11-09 ENCOUNTER — Other Ambulatory Visit (HOSPITAL_COMMUNITY): Payer: Commercial Managed Care - HMO

## 2014-11-19 ENCOUNTER — Encounter: Payer: Self-pay | Admitting: Pulmonary Disease

## 2014-11-19 ENCOUNTER — Telehealth (HOSPITAL_COMMUNITY): Payer: Self-pay

## 2014-11-19 ENCOUNTER — Ambulatory Visit (INDEPENDENT_AMBULATORY_CARE_PROVIDER_SITE_OTHER): Payer: Commercial Managed Care - HMO | Admitting: Pulmonary Disease

## 2014-11-19 VITALS — BP 90/58 | HR 84 | Ht 64.0 in | Wt 125.0 lb

## 2014-11-19 DIAGNOSIS — M0510 Rheumatoid lung disease with rheumatoid arthritis of unspecified site: Secondary | ICD-10-CM

## 2014-11-19 NOTE — Patient Instructions (Signed)
Follow up in 1 year.

## 2014-11-19 NOTE — Telephone Encounter (Signed)
Patient given detailed instructions per Myocardial Perfusion Study Information Sheet for test on 11-23-2014 at 12:00. Patient verbalized understanding. Irven Baltimore, RN.

## 2014-11-19 NOTE — Progress Notes (Signed)
Chief Complaint  Patient presents with  . Follow-up    Review CT- Denies any increased breathing issues since last OV. BP is low today.    CC: Dr. Charlestine Night  History of Present Illness: Cathy Leonard is a 74 y.o. female with steroid responsive b/l GGO pulmonary infiltrates on CT chest in July 2015.  She has hx of RA.  She is here to review her CT chest.  This did not show any progression of disease in her lungs.  She denies cough, wheeze, sputum, fever, chest pain, or hemoptysis.    She was seen by cardiology.  She had minor ECG changes and was noted to have coronary calcifications on CT chest.  As a result she was scheduled for stress test.  TESTS: 02/10/14 CT chest >> 8 mm nodule at level of Rt hilum, 8 mm nodule RLL, 6 mm nodule Rt lung base >> no changes since 2012; diffuse, b/l GGO  02/11/14 Labs >> RF 21, anti CCP 122.7, ESR 39, ANA positive 1:80 PFT 04/16/14 >> FEV1 1.90 (88%), FEV1% 83, TLC 4.05 (81%), DLCO 67%, no BD CT chest 05/04/14 >> 7 mm RLL nodule/4 mm nodule RUL no change since 2012, peripheral GGO at lung bases b/l, mild traction BTX, moderate airtrapping CT chest 11/03/14 >> ?mild progression of basilar predominant fibrosis, no change to scattered nodules since 2012   Past medical hx >> PE 2009, Breast cancer 2004, MVP, Macular degeneration  Past surgical hx, Medications, Allergies, Family hx, Social hx all reviewed.   Physical Exam: Blood pressure 90/58, pulse 84, height $RemoveBe'5\' 4"'eYXjVtyyE$  (1.626 m), weight 125 lb (56.7 kg), SpO2 96 %. Body mass index is 21.45 kg/(m^2).  General - No distress ENT - No sinus tenderness, no oral exudate, no LAN Cardiac - s1s2 regular, no murmur Chest - faint basilar crackles Rt > Lt, no wheeze Back - No focal tenderness Abd - Soft, non-tender Ext - No edema Neuro - Normal strength Skin - No rashes Psych - normal mood, and behavior   Assessment/Plan:  Interstitial lung disease with mild GGO/BTX/pulmonary nodules in setting of rheumatoid  arthritis. She has stable symptoms and no significant progression on CT chest. Plan: - monitor clinically - defer additional testing with CT chest and PFT unless her symptoms progress  Coronary calcifications on CT chest. Plan: - she is scheduled for stress test with cardiology  Low BP in office today. She denies symptoms to go along with this. Plan: - she can f/u with PCP and cardiology to adjust her dose of inderal   Chesley Mires, MD [facility] Pager:  [phone]

## 2014-11-23 ENCOUNTER — Ambulatory Visit (HOSPITAL_COMMUNITY): Payer: Commercial Managed Care - HMO

## 2014-11-23 ENCOUNTER — Other Ambulatory Visit: Payer: Self-pay | Admitting: Cardiology

## 2014-11-23 ENCOUNTER — Ambulatory Visit (HOSPITAL_COMMUNITY): Payer: Commercial Managed Care - HMO | Attending: Cardiology

## 2014-11-23 DIAGNOSIS — R0609 Other forms of dyspnea: Secondary | ICD-10-CM

## 2014-11-23 DIAGNOSIS — I2510 Atherosclerotic heart disease of native coronary artery without angina pectoris: Secondary | ICD-10-CM

## 2014-11-23 DIAGNOSIS — R9431 Abnormal electrocardiogram [ECG] [EKG]: Secondary | ICD-10-CM | POA: Diagnosis not present

## 2014-11-23 MED ORDER — TECHNETIUM TC 99M SESTAMIBI GENERIC - CARDIOLITE
11.0000 | Freq: Once | INTRAVENOUS | Status: AC | PRN
  Administered 2014-11-23: 11 via INTRAVENOUS

## 2014-11-23 MED ORDER — TECHNETIUM TC 99M SESTAMIBI GENERIC - CARDIOLITE
33.0000 | Freq: Once | INTRAVENOUS | Status: AC | PRN
  Administered 2014-11-23: 33 via INTRAVENOUS

## 2014-11-23 MED ORDER — REGADENOSON 0.4 MG/5ML IV SOLN
0.4000 mg | Freq: Once | INTRAVENOUS | Status: AC
  Administered 2014-11-23: 0.4 mg via INTRAVENOUS

## 2014-11-26 LAB — MYOCARDIAL PERFUSION IMAGING
CHL CUP NUCLEAR SSS: 5
CHL CUP RESTING HR STRESS: 70 {beats}/min
CHL CUP STRESS STAGE 1 DBP: 75 mmHg
CHL CUP STRESS STAGE 1 GRADE: 0 %
CHL CUP STRESS STAGE 1 SBP: 134 mmHg
CHL CUP STRESS STAGE 3 DBP: 89 mmHg
CHL CUP STRESS STAGE 3 GRADE: 0 %
CHL CUP STRESS STAGE 3 SPEED: 0 mph
CHL CUP STRESS STAGE 4 HR: 104 {beats}/min
CHL CUP STRESS STAGE 4 SPEED: 0 mph
CHL CUP STRESS STAGE 6 HR: 93 {beats}/min
CHL CUP STRESS STAGE 6 SBP: 166 mmHg
CHL CUP STRESS STAGE 6 SPEED: 0 mph
CSEPEW: 1 METS
LV dias vol: 62 mL
LVSYSVOL: 19 mL
Nuc Stress EF: 69 %
Peak HR: 104 {beats}/min
Percent of predicted max HR: 70 %
RATE: 0.39
SDS: 3
SRS: 2
Stage 1 HR: 75 {beats}/min
Stage 1 Speed: 0 mph
Stage 2 Grade: 0 %
Stage 2 HR: 75 {beats}/min
Stage 2 Speed: 0 mph
Stage 3 HR: 83 {beats}/min
Stage 3 SBP: 154 mmHg
Stage 4 Grade: 0 %
Stage 5 DBP: 85 mmHg
Stage 5 Grade: 0 %
Stage 5 HR: 103 {beats}/min
Stage 5 SBP: 165 mmHg
Stage 5 Speed: 0 mph
Stage 6 DBP: 82 mmHg
Stage 6 Grade: 0 %
TID: 0.39

## 2014-11-27 ENCOUNTER — Telehealth: Payer: Self-pay | Admitting: *Deleted

## 2014-11-27 NOTE — Telephone Encounter (Signed)
-----   Message from Darlin Coco, MD sent at 11/24/2014 12:50 PM EDT ----- Please report.  The stress test was normal.  No evidence of ischemia.

## 2014-11-27 NOTE — Telephone Encounter (Signed)
Notes Recorded by Earvin Hansen on 11/25/2014 at 12:36 PM Advised patient

## 2014-12-17 ENCOUNTER — Other Ambulatory Visit: Payer: Self-pay

## 2014-12-17 ENCOUNTER — Encounter: Payer: Self-pay | Admitting: Cardiology

## 2014-12-17 MED ORDER — PROPRANOLOL HCL 20 MG PO TABS: 20.0000 mg | ORAL_TABLET | Freq: Two times a day (BID) | ORAL | Status: DC

## 2014-12-17 NOTE — Telephone Encounter (Signed)
Per note 4.15.16

## 2014-12-23 DIAGNOSIS — K648 Other hemorrhoids: Secondary | ICD-10-CM

## 2014-12-23 HISTORY — DX: Other hemorrhoids: K64.8

## 2015-01-18 ENCOUNTER — Other Ambulatory Visit: Payer: Self-pay

## 2015-02-25 ENCOUNTER — Other Ambulatory Visit: Payer: Self-pay

## 2015-02-25 ENCOUNTER — Other Ambulatory Visit: Payer: Self-pay | Admitting: Family Medicine

## 2015-02-25 DIAGNOSIS — Z1231 Encounter for screening mammogram for malignant neoplasm of breast: Secondary | ICD-10-CM

## 2015-03-03 ENCOUNTER — Other Ambulatory Visit: Payer: Self-pay | Admitting: Family Medicine

## 2015-03-03 ENCOUNTER — Ambulatory Visit: Payer: Commercial Managed Care - HMO

## 2015-03-03 DIAGNOSIS — M0689 Other specified rheumatoid arthritis, multiple sites: Secondary | ICD-10-CM

## 2015-03-10 ENCOUNTER — Other Ambulatory Visit: Payer: Self-pay | Admitting: Family Medicine

## 2015-03-10 ENCOUNTER — Other Ambulatory Visit: Payer: Self-pay | Admitting: Cardiology

## 2015-03-10 DIAGNOSIS — E2839 Other primary ovarian failure: Secondary | ICD-10-CM

## 2015-03-10 MED ORDER — PROPRANOLOL HCL 20 MG PO TABS: 20.0000 mg | ORAL_TABLET | Freq: Two times a day (BID) | ORAL | Status: AC

## 2015-03-18 ENCOUNTER — Telehealth: Payer: Self-pay | Admitting: *Deleted

## 2015-03-18 ENCOUNTER — Ambulatory Visit
Admission: RE | Admit: 2015-03-18 | Discharge: 2015-03-18 | Disposition: A | Discharge status: Home / Self Care | Payer: Commercial Managed Care - HMO | Source: Ambulatory Visit

## 2015-03-18 ENCOUNTER — Ambulatory Visit
Admission: RE | Admit: 2015-03-18 | Discharge: 2015-03-18 | Disposition: A | Discharge status: Home / Self Care | Payer: Commercial Managed Care - HMO | Source: Ambulatory Visit | Attending: Family Medicine | Admitting: Family Medicine

## 2015-03-18 DIAGNOSIS — E2839 Other primary ovarian failure: Secondary | ICD-10-CM

## 2015-03-18 DIAGNOSIS — Z1231 Encounter for screening mammogram for malignant neoplasm of breast: Secondary | ICD-10-CM

## 2015-03-18 MED ORDER — ALPRAZOLAM 0.5 MG PO TABS: 0.5000 mg | ORAL_TABLET | ORAL | Status: AC | PRN

## 2015-03-18 NOTE — Telephone Encounter (Signed)
Okay to refill alprazalam

## 2015-03-18 NOTE — Telephone Encounter (Signed)
Patient called and requested a printed rx for alprazolam. Please call when this is ready for her to pick up. Thanks, MI

## 2015-03-18 NOTE — Telephone Encounter (Signed)
Advised patient ready for pick up

## 2015-04-02 ENCOUNTER — Encounter (HOSPITAL_COMMUNITY): Payer: Self-pay | Admitting: Emergency Medicine

## 2015-04-02 ENCOUNTER — Emergency Department (HOSPITAL_COMMUNITY)
Admission: EM | Admit: 2015-04-02 | Discharge: 2015-04-02 | Disposition: A | Discharge status: Home / Self Care | Payer: Commercial Managed Care - HMO | Attending: Emergency Medicine | Admitting: Emergency Medicine

## 2015-04-02 DIAGNOSIS — R5383 Other fatigue: Secondary | ICD-10-CM | POA: Insufficient documentation

## 2015-04-02 DIAGNOSIS — Z86018 Personal history of other benign neoplasm: Secondary | ICD-10-CM | POA: Diagnosis not present

## 2015-04-02 DIAGNOSIS — Z862 Personal history of diseases of the blood and blood-forming organs and certain disorders involving the immune mechanism: Secondary | ICD-10-CM | POA: Insufficient documentation

## 2015-04-02 DIAGNOSIS — N390 Urinary tract infection, site not specified: Secondary | ICD-10-CM | POA: Insufficient documentation

## 2015-04-02 DIAGNOSIS — Z8739 Personal history of other diseases of the musculoskeletal system and connective tissue: Secondary | ICD-10-CM | POA: Diagnosis not present

## 2015-04-02 DIAGNOSIS — Z7901 Long term (current) use of anticoagulants: Secondary | ICD-10-CM | POA: Insufficient documentation

## 2015-04-02 DIAGNOSIS — Z79899 Other long term (current) drug therapy: Secondary | ICD-10-CM | POA: Insufficient documentation

## 2015-04-02 DIAGNOSIS — K644 Residual hemorrhoidal skin tags: Secondary | ICD-10-CM | POA: Diagnosis not present

## 2015-04-02 DIAGNOSIS — Z86711 Personal history of pulmonary embolism: Secondary | ICD-10-CM | POA: Diagnosis not present

## 2015-04-02 DIAGNOSIS — Z8669 Personal history of other diseases of the nervous system and sense organs: Secondary | ICD-10-CM | POA: Diagnosis not present

## 2015-04-02 DIAGNOSIS — K625 Hemorrhage of anus and rectum: Secondary | ICD-10-CM

## 2015-04-02 DIAGNOSIS — R630 Anorexia: Secondary | ICD-10-CM | POA: Insufficient documentation

## 2015-04-02 DIAGNOSIS — Z853 Personal history of malignant neoplasm of breast: Secondary | ICD-10-CM | POA: Diagnosis not present

## 2015-04-02 DIAGNOSIS — R319 Hematuria, unspecified: Secondary | ICD-10-CM

## 2015-04-02 HISTORY — DX: Other hemorrhoids: K64.8

## 2015-04-02 LAB — URINALYSIS, ROUTINE W REFLEX MICROSCOPIC
Bilirubin Urine: NEGATIVE
GLUCOSE, UA: NEGATIVE mg/dL
KETONES UR: NEGATIVE mg/dL
NITRITE: POSITIVE — AB
PH: 6 (ref 5.0–8.0)
PROTEIN: NEGATIVE mg/dL
Specific Gravity, Urine: 1.022 (ref 1.005–1.030)
Urobilinogen, UA: 0.2 mg/dL (ref 0.0–1.0)

## 2015-04-02 LAB — COMPREHENSIVE METABOLIC PANEL
ALBUMIN: 4 g/dL (ref 3.5–5.0)
ALK PHOS: 49 U/L (ref 38–126)
ALT: 16 U/L (ref 14–54)
AST: 23 U/L (ref 15–41)
Anion gap: 7 (ref 5–15)
BILIRUBIN TOTAL: 1 mg/dL (ref 0.3–1.2)
BUN: 19 mg/dL (ref 6–20)
CO2: 29 mmol/L (ref 22–32)
CREATININE: 1.07 mg/dL — AB (ref 0.44–1.00)
Calcium: 10 mg/dL (ref 8.9–10.3)
Chloride: 107 mmol/L (ref 101–111)
GFR calc Af Amer: 58 mL/min — ABNORMAL LOW (ref >60)
GFR, EST NON AFRICAN AMERICAN: 50 mL/min — AB (ref >60)
GLUCOSE: 109 mg/dL — AB (ref 65–99)
Potassium: 3.8 mmol/L (ref 3.5–5.1)
Sodium: 143 mmol/L (ref 135–145)
TOTAL PROTEIN: 6.9 g/dL (ref 6.5–8.1)

## 2015-04-02 LAB — CBC
HCT: 38.3 % (ref 36.0–46.0)
Hemoglobin: 12.7 g/dL (ref 12.0–15.0)
MCH: 32.1 pg (ref 26.0–34.0)
MCHC: 33.2 g/dL (ref 30.0–36.0)
MCV: 96.7 fL (ref 78.0–100.0)
PLATELETS: 179 10*3/uL (ref 150–400)
RBC: 3.96 MIL/uL (ref 3.87–5.11)
RDW: 13.9 % (ref 11.5–15.5)
WBC: 7.6 10*3/uL (ref 4.0–10.5)

## 2015-04-02 LAB — PROTIME-INR
INR: 1.96 — AB (ref 0.00–1.49)
Prothrombin Time: 22.2 seconds — ABNORMAL HIGH (ref 11.6–15.2)

## 2015-04-02 LAB — ABO/RH: ABO/RH(D): O POS

## 2015-04-02 LAB — TYPE AND SCREEN
ABO/RH(D): O POS
ANTIBODY SCREEN: NEGATIVE

## 2015-04-02 LAB — URINE MICROSCOPIC-ADD ON

## 2015-04-02 LAB — POC OCCULT BLOOD, ED: Fecal Occult Bld: NEGATIVE

## 2015-04-02 MED ORDER — CEPHALEXIN 500 MG PO CAPS: 500.0000 mg | ORAL_CAPSULE | Freq: Four times a day (QID) | ORAL | Status: DC

## 2015-04-02 MED ORDER — CIPROFLOXACIN IN D5W 400 MG/200ML IV SOLN
400.0000 mg | Freq: Once | INTRAVENOUS | Status: AC
  Administered 2015-04-02: 400 mg via INTRAVENOUS
  Filled 2015-04-02: qty 200

## 2015-04-02 MED ORDER — CEPHALEXIN 500 MG PO CAPS: 500.0000 mg | ORAL_CAPSULE | Freq: Four times a day (QID) | ORAL | Status: AC

## 2015-04-02 NOTE — Discharge Instructions (Signed)
Rectal Bleeding Stop your coumadin 2 days before your colonoscopy. Followup with your doctor regarding your INR. Return to the ED if you develop worsening pain, bleeding, dizziness, chest pain or any other concerns. Rectal bleeding is when blood passes out of the anus. It is usually a sign that something is wrong. It may not be serious, but it should always be evaluated. Rectal bleeding may present as bright red blood or extremely dark stools. The color may range from dark red or maroon to black (like tar). It is important that the cause of rectal bleeding be identified so treatment can be started and the problem corrected. CAUSES   Hemorrhoids. These are enlarged (dilated) blood vessels or veins in the anal or rectal area.  Fistulas. Theseare abnormal, burrowing channels that usually run from inside the rectum to the skin around the anus. They can bleed.  Anal fissures. This is a tear in the tissue of the anus. Bleeding occurs with bowel movements.  Diverticulosis. This is a condition in which pockets or sacs project from the bowel wall. Occasionally, the sacs can bleed.  Diverticulitis. Thisis an infection involving diverticulosis of the colon.  Proctitis and colitis. These are conditions in which the rectum, colon, or both, can become inflamed and pitted (ulcerated).  Polyps and cancer. Polyps are non-cancerous (benign) growths in the colon that may bleed. Certain types of polyps turn into cancer.  Protrusion of the rectum. Part of the rectum can project from the anus and bleed.  Certain medicines.  Intestinal infections.  Blood vessel abnormalities. HOME CARE INSTRUCTIONS  Eat a high-fiber diet to keep your stool soft.  Limit activity.  Drink enough fluids to keep your urine clear or pale yellow.  Warm baths may be useful to soothe rectal pain.  Follow up with your caregiver as directed. SEEK IMMEDIATE MEDICAL CARE IF:  You develop increased bleeding.  You have black or  dark red stools.  You vomit blood or material that looks like coffee grounds.  You have abdominal pain or tenderness.  You have a fever.  You feel weak, nauseous, or you faint.  You have severe rectal pain or you are unable to have a bowel movement. MAKE SURE YOU:  Understand these instructions.  Will watch your condition.  Will get help right away if you are not doing well or get worse. Document Released: 12/30/2001 Document Revised: 10/02/2011 Document Reviewed: 12/25/2010 [facility] Patient Information 2015 [place], [place]. This information is not intended to replace advice given to you by your health care provider. Make sure you discuss any questions you have with your health care provider.

## 2015-04-02 NOTE — ED Notes (Signed)
Pt states Tuesday she began having rectal bleeding, saturating about 1 pad/day. States she's been out of town, and her PCP recommended she come here because she's on Warfarin. States it's worse when she's trying to have a bowel movement because she's been having bloody/mucous filled diarrhea since Tuesday. States she's been having bowel problems, and she's scheduled for a colonoscopy next Friday. Was diagnosed with internal hemorrhoids in June. Pt denies N/V, dizziness. States the only symptom is that she just feels exhausted.

## 2015-04-02 NOTE — ED Provider Notes (Signed)
CSN: [id number]     Arrival date & time 04/02/15  1147 History   First MD Initiated Contact with Patient 04/02/15 1205     Chief Complaint  Patient presents with  . Rectal Bleeding  . Weakness     (Consider location/radiation/quality/duration/timing/severity/associated sxs/prior Treatment) HPI Comments: Patient presents with worsening rectal bleeding over the past 3 days. Reports bloody stools and red stool and toilet water turning red. 3 times daily. She is on Coumadin. Reports having intermittent rectal bleeding since June and is scheduled for a colonoscopy next week. Diagnosed with internal hemorrhoids in June. She endorses fatigue and exhaustion but no vomiting, nausea, dizziness, chest pain or shortness of breath. She has some rectal pain but no significant abdominal pain. Denies any dizziness or lightheadedness. Last bloody bowel movement was yesterday. Called [facility] was told to come to the ED.  Patient is a 74 y.o. female presenting with weakness. The history is provided by the patient. The history is limited by the condition of the patient.  Weakness Associated symptoms include abdominal pain. Pertinent negatives include no chest pain, no headaches and no shortness of breath.    Past Medical History  Diagnosis Date  . Rheumatoid arthritis(714.0)   . Pulmonary embolism 2009  . Breast cancer 2004  . Osteoporosis   . Lung nodules     Benign  . Thrombocytopenia   . Macular degeneration   . Mitral valve prolapse   . Palpitations   . Diverticulitis   . Internal hemorrhoid 12/23/14   Past Surgical History  Procedure Laterality Date  . Breast lumpectomy  2003    Left, Chemotherapy/Radiation  . Nuclear sress test  2008    EF 67%, Normal  . US echocardiography  2010    EF 55-60%, Mild Aortic Sclerosis, MVP, mild MR   Family History  Problem Relation Age of Onset  . Breast cancer Mother 64  . Breast cancer Sister 25   Social History  Substance Use Topics  . Smoking  status: Never Smoker   . Smokeless tobacco: None  . Alcohol Use: No   OB History    No data available     Review of Systems  Constitutional: Positive for activity change, appetite change and fatigue. Negative for fever.  HENT: Negative for congestion and rhinorrhea.   Eyes: Negative for visual disturbance.  Respiratory: Negative for cough, chest tightness and shortness of breath.   Cardiovascular: Negative for chest pain.  Gastrointestinal: Positive for abdominal pain, blood in stool, hematochezia and rectal pain. Negative for nausea, vomiting and diarrhea.  Genitourinary: Negative for dysuria, hematuria, vaginal bleeding and vaginal discharge.  Musculoskeletal: Negative for myalgias and arthralgias.  Skin: Negative for rash.  Neurological: Positive for weakness. Negative for dizziness, light-headedness and headaches.  A complete 10 system review of systems was obtained and all systems are negative except as noted in the HPI and PMH.      Allergies  Rocephin  Home Medications   Prior to Admission medications   Medication Sig Start Date End Date Taking? Authorizing Provider  ALPRAZolam Duanne Moron) 0.5 MG tablet Take 1 tablet (0.5 mg total) by mouth as needed for anxiety (for anxiety). 03/18/15  Yes Darlin Coco, MD  B Complex-C (B-COMPLEX WITH VITAMIN C) tablet Take 1 tablet by mouth daily.   Yes Historical Provider, MD  Calcium Carbonate-Vitamin D (CALCIUM + D PO) Take 600 mg by mouth daily.    Yes Historical Provider, MD  Cholecalciferol (VITAMIN D PO) Take 1,000 Units by  mouth 2 (two) times daily.    Yes Historical Provider, MD  digoxin (LANOXIN) 0.125 MG tablet Take 0.125 mg by mouth daily.   Yes Historical Provider, MD  etanercept (ENBREL) 50 MG/ML injection Inject 50 mg into the skin as directed. Pt takes 3 times a month, on the 1st, 11th, and 21st of each month   Yes Historical Provider, MD  folic acid (FOLVITE) 659 MCG tablet Take 400 mcg by mouth 2 (two) times daily.   Yes  Historical Provider, MD  Multiple Vitamins-Minerals (EYE-VITES) TABS Take by mouth daily.     Yes Historical Provider, MD  propranolol (INDERAL) 20 MG tablet Take 1 tablet (20 mg total) by mouth 2 (two) times daily. 03/10/15  Yes Darlin Coco, MD  warfarin (COUMADIN) 5 MG tablet Take 1/2 tablet Tuesday, Thursday, and Saturday. Take 1 tablet all other days. 02/13/14  Yes Belkys A Regalado, MD  alendronate (FOSAMAX) 70 MG tablet Take 1 tablet by mouth once a week. 03/30/15   Historical Provider, MD  cephALEXin (KEFLEX) 500 MG capsule Take 1 capsule (500 mg total) by mouth 4 (four) times daily. 04/02/15   Ezequiel Essex, MD   BP 135/88 mmHg  Pulse 73  Temp(Src) 98 F (36.7 C) (Oral)  Resp 16  SpO2 100% Physical Exam  Constitutional: She is oriented to person, place, and time. She appears well-developed and well-nourished. No distress.  HENT:  Head: Normocephalic and atraumatic.  Mouth/Throat: Oropharynx is clear and moist. No oropharyngeal exudate.  Eyes: Conjunctivae and EOM are normal. Pupils are equal, round, and reactive to light.  Neck: Normal range of motion. Neck supple.  No meningismus.  Cardiovascular: Normal rate, regular rhythm, normal heart sounds and intact distal pulses.   No murmur heard. Pulmonary/Chest: Effort normal and breath sounds normal. No respiratory distress.  Abdominal: Soft. There is no tenderness. There is no rebound and no guarding.  Genitourinary: Guaiac negative stool.  Chaperone present, small external hemorrhoids without evidence of recent bleeding. No fissures. No gross blood. Palpable internal hemorrhoid  Musculoskeletal: Normal range of motion. She exhibits no edema or tenderness.  Neurological: She is alert and oriented to person, place, and time. No cranial nerve deficit. She exhibits normal muscle tone. Coordination normal.  No ataxia on finger to nose bilaterally. No pronator drift. 5/5 strength throughout. CN 2-12 intact (asymmetric mouth, improves  with smiling, chronic per patient). Equal grip strength. Sensation intact.   Skin: Skin is warm.  Psychiatric: She has a normal mood and affect. Her behavior is normal.  Nursing note and vitals reviewed.   ED Course  Procedures (including critical care time) Labs Review Labs Reviewed  COMPREHENSIVE METABOLIC PANEL - Abnormal; Notable for the following:    Glucose, Bld 109 (*)    Creatinine, Ser 1.07 (*)    GFR calc non Af Amer 50 (*)    GFR calc Af Amer 58 (*)    All other components within normal limits  PROTIME-INR - Abnormal; Notable for the following:    Prothrombin Time 22.2 (*)    INR 1.96 (*)    All other components within normal limits  URINALYSIS, ROUTINE W REFLEX MICROSCOPIC (NOT AT [facility]) - Abnormal; Notable for the following:    Color, Urine AMBER (*)    APPearance CLOUDY (*)    Hgb urine dipstick SMALL (*)    Nitrite POSITIVE (*)    Leukocytes, UA MODERATE (*)    All other components within normal limits  URINE MICROSCOPIC-ADD ON - Abnormal; Notable  for the following:    Bacteria, UA MANY (*)    Crystals CA OXALATE CRYSTALS (*)    All other components within normal limits  CBC  POC OCCULT BLOOD, ED  POC OCCULT BLOOD, ED  TYPE AND SCREEN  ABO/RH    Imaging Review No results found. I have personally reviewed and evaluated these images and lab results as part of my medical decision-making.   EKG Interpretation None      MDM   Final diagnoses:  Rectal bleeding  Urinary tract infection with hematuria, site unspecified   Rectal bleeding in setting of Coumadin use. No significant abdominal pain. Vitals are stable.  Hemoglobin stable at 13. INR 1.96. Urinalysis is positive for infection. Orthostatics are negative.  Patient with no abdominal pain. Discussed with GI doctor [place]. He feels this is a chronic bleeding issue and likely hemorrhoidal and patient does not need to be admitted today as she has had intermittent bleeding since June. Her vitals are  stable here hemoglobin is stable. He feels this is likely hemorrhoidal as would expect hemoglobin to be lower with diverticular bleed. Recommends she calls office to move up colonoscopy.  Discussed the patient's PCP Dr. Addison Lank as well. She agrees with outpatient management. She advises patient to hold Coumadin for 2 days prior to procedure. Treat UTI.  Return Precautions discussed including worsening pain, bleeding, dizziness, chest pain or any other concerns.  BP 135/88 mmHg  Pulse 73  Temp(Src) 98 F (36.7 C) (Oral)  Resp 16  SpO2 100%    Ezequiel Essex, MD 04/02/15 1643

## 2015-04-09 ENCOUNTER — Other Ambulatory Visit: Payer: Self-pay | Admitting: Gastroenterology

## 2015-04-13 ENCOUNTER — Telehealth: Payer: Self-pay | Admitting: *Deleted

## 2015-04-13 NOTE — Telephone Encounter (Signed)
VM from [facility] asking for status of the referral to Dr. Benay Spice from Dr. Penelope Coop. Called back and left VM that referral has been received and in process.

## 2015-04-13 NOTE — Telephone Encounter (Signed)
Dr. Benay Spice reviewed colonoscopy/path report and feels Cathy Leonard needs to see surgeon, Dr. Johney [place] or Dr. 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 Called Amber with [facility] to make Dr. Penelope Coop aware. Navigator willing to put in surgery referral for him if OK. Amber will have his assistant, Armour call back.

## 2015-04-15 ENCOUNTER — Telehealth: Payer: Self-pay | Admitting: *Deleted

## 2015-04-15 NOTE — Telephone Encounter (Signed)
Voicemail from patient asking for return call from Navigator in reference to referral.  Call forwarded to GI navigator.

## 2015-04-15 NOTE — Telephone Encounter (Signed)
Received message from pt stating "Cathy Leonard was going to call me back about setting up an appointment with Dr. Benay Spice and I've haven't heard from her; can someone call me"  Per Dr. Benay Spice; notified pt that Dr. Benay Spice would be glad to see her but she needs to see the surgeon first to discuss options; then surgeon will discuss with Dr. Benay Spice if needed.  Pt verbalized understanding and request that Cathy Leonard also give her a call.  Notified pt that Cathy Leonard is out of the office today and will be made aware to return call tomorrow.  Pt verbalized understanding.

## 2015-04-22 ENCOUNTER — Telehealth: Payer: Self-pay | Admitting: *Deleted

## 2015-04-22 NOTE — Telephone Encounter (Signed)
Called patient with name of surgeon in [place] that a surgeon at [facility] knows when he practiced there and gave name of medical oncology practice [facility]. Provided phone #'s of both. Will mail her information with her path report to new address: [address] [place], [place] [address]

## 2015-05-26 ENCOUNTER — Encounter: Payer: Self-pay | Admitting: Cardiology

## 2020-05-24 DEATH — deceased
